# Patient Record
Sex: Female | Born: 1935 | Race: White | Hispanic: No | Smoking: Former smoker
Health system: Southern US, Community
[De-identification: ages and names within clinical notes are randomized; demographics above are authoritative.]

## PROBLEM LIST (undated history)

## (undated) DIAGNOSIS — I890 Lymphedema, not elsewhere classified: Secondary | ICD-10-CM

## (undated) DIAGNOSIS — B351 Tinea unguium: Secondary | ICD-10-CM

## (undated) HISTORY — DX: Lymphedema, not elsewhere classified: I89.0

## (undated) HISTORY — DX: Tinea unguium: B35.1

---

## 2005-12-12 ENCOUNTER — Ambulatory Visit: Payer: Self-pay | Admitting: Internal Medicine

## 2006-12-18 ENCOUNTER — Ambulatory Visit: Payer: Self-pay | Admitting: Internal Medicine

## 2007-12-23 ENCOUNTER — Ambulatory Visit: Payer: Self-pay | Admitting: Internal Medicine

## 2008-12-30 ENCOUNTER — Ambulatory Visit: Payer: Self-pay | Admitting: Internal Medicine

## 2010-01-25 ENCOUNTER — Ambulatory Visit: Payer: Self-pay | Admitting: Internal Medicine

## 2011-01-30 ENCOUNTER — Ambulatory Visit: Payer: Self-pay | Admitting: Internal Medicine

## 2017-07-31 ENCOUNTER — Other Ambulatory Visit: Payer: Self-pay | Admitting: Internal Medicine

## 2017-07-31 ENCOUNTER — Ambulatory Visit
Admission: RE | Admit: 2017-07-31 | Discharge: 2017-07-31 | Disposition: A | Discharge status: Home / Self Care | Payer: Medicare Other | Source: Ambulatory Visit | Attending: Internal Medicine | Admitting: Internal Medicine

## 2017-07-31 DIAGNOSIS — M25551 Pain in right hip: Secondary | ICD-10-CM | POA: Diagnosis not present

## 2017-07-31 DIAGNOSIS — M1611 Unilateral primary osteoarthritis, right hip: Secondary | ICD-10-CM | POA: Diagnosis not present

## 2017-07-31 DIAGNOSIS — M419 Scoliosis, unspecified: Secondary | ICD-10-CM | POA: Diagnosis not present

## 2017-07-31 DIAGNOSIS — M47816 Spondylosis without myelopathy or radiculopathy, lumbar region: Secondary | ICD-10-CM | POA: Diagnosis not present

## 2017-07-31 DIAGNOSIS — M79604 Pain in right leg: Secondary | ICD-10-CM | POA: Diagnosis not present

## 2018-05-03 IMAGING — DX DG FEMUR 2+V*R*
5 series · 5 of 5 positions shown · non-contrast
Comparison: None.

CLINICAL DATA: Right hip pain and right leg pain 3 years, worse in
last 6 months. No recent injury.

EXAM:
RIGHT FEMUR 2 VIEWS

[femur ap (1 of 2)]
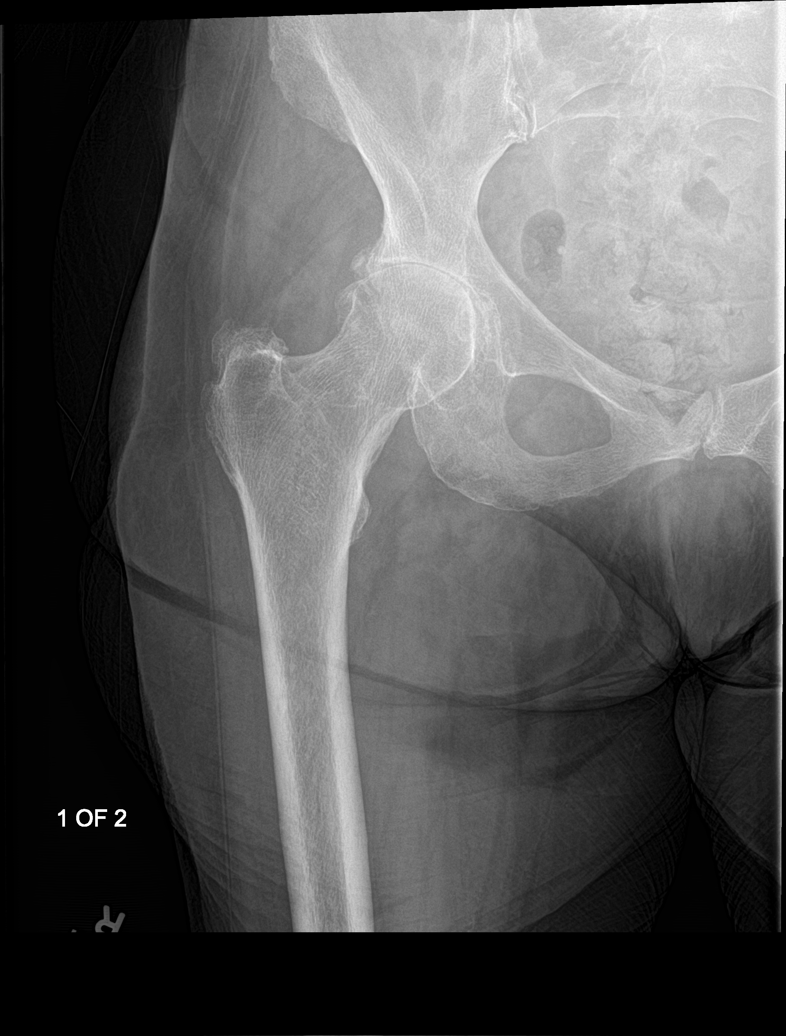

[femur ap (2 of 2)]
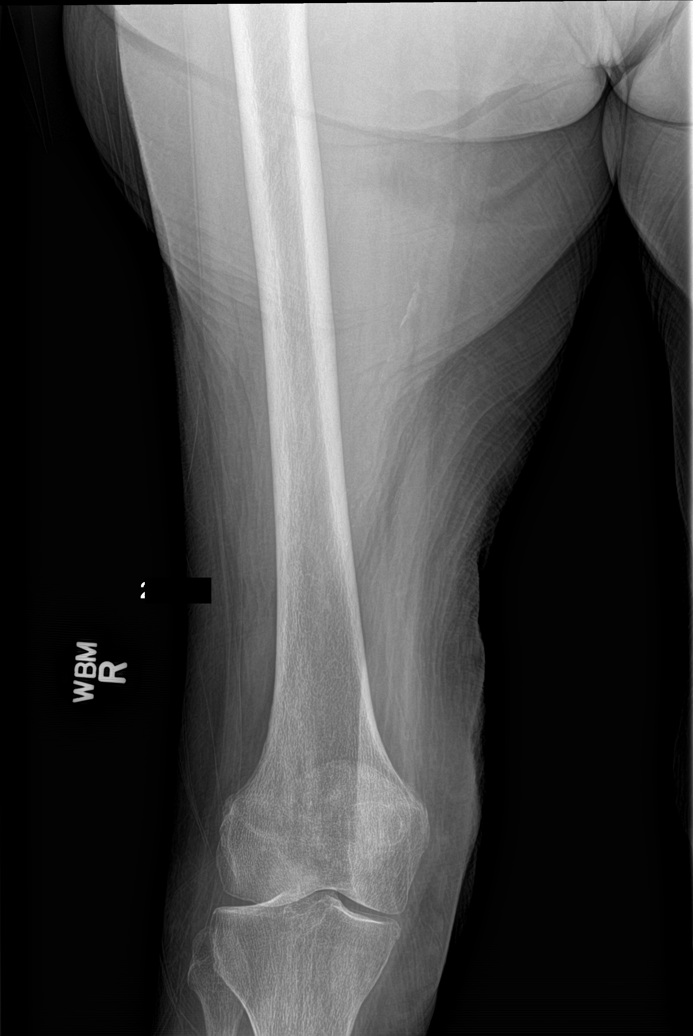

[femur lat (1 of 3)]
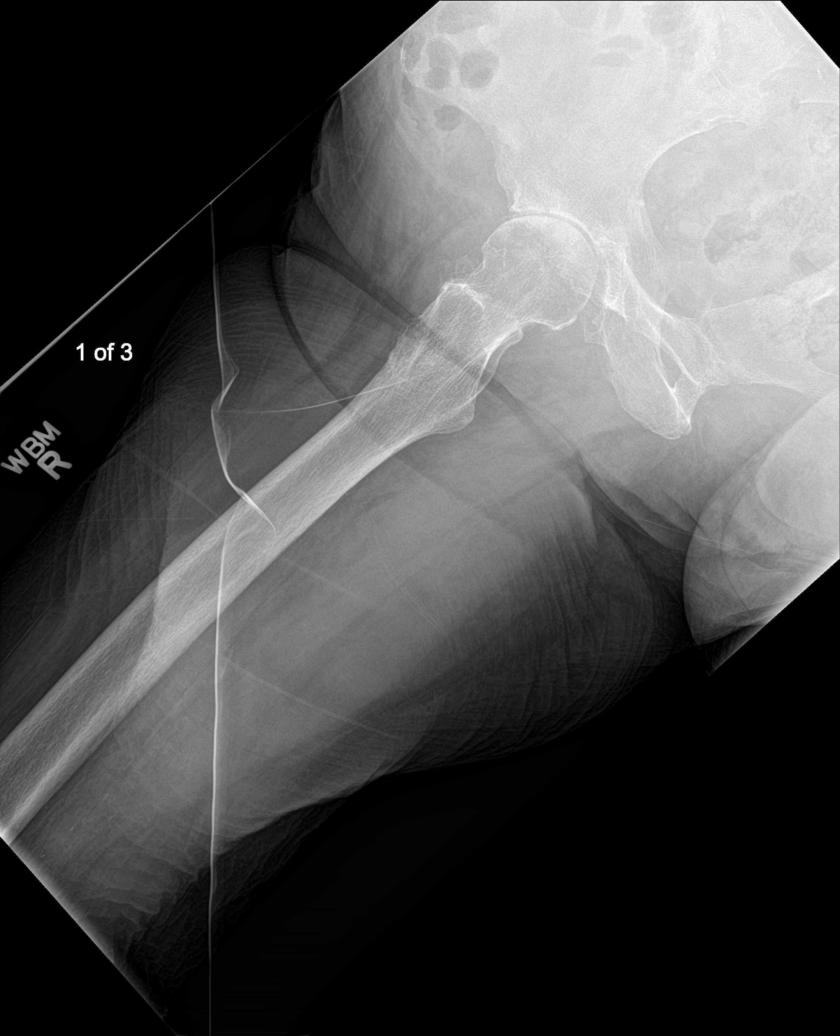

[femur lat (2 of 3)]
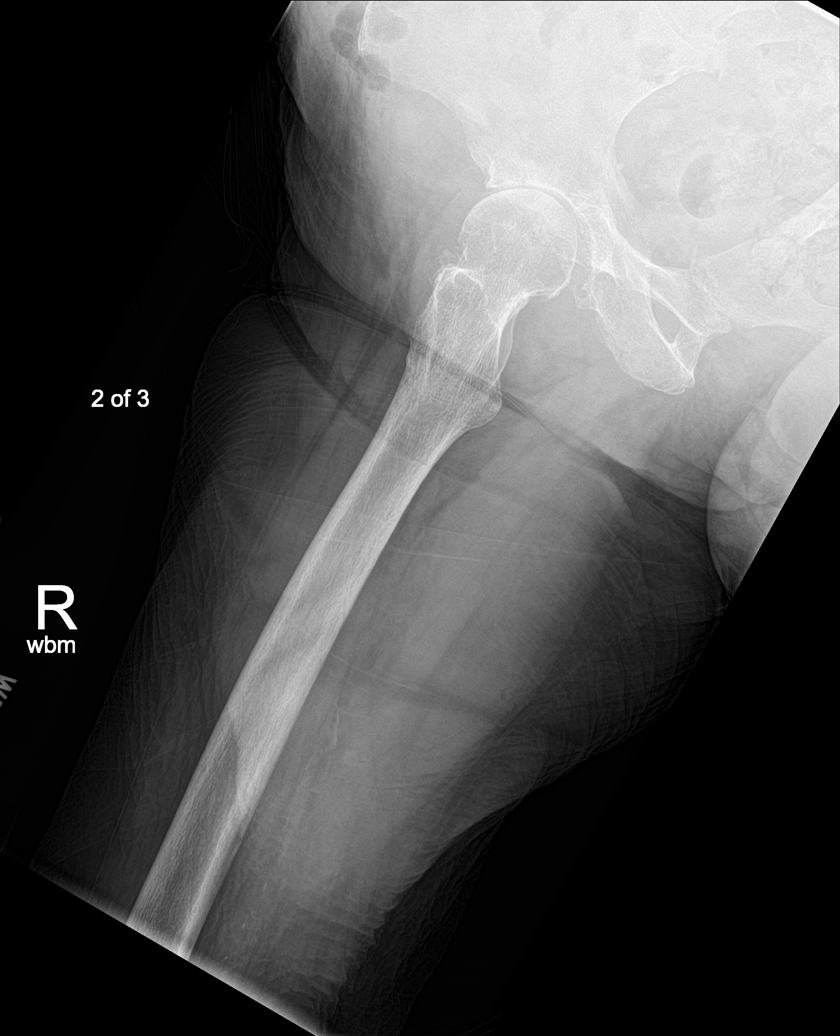

[femur lat (3 of 3)]
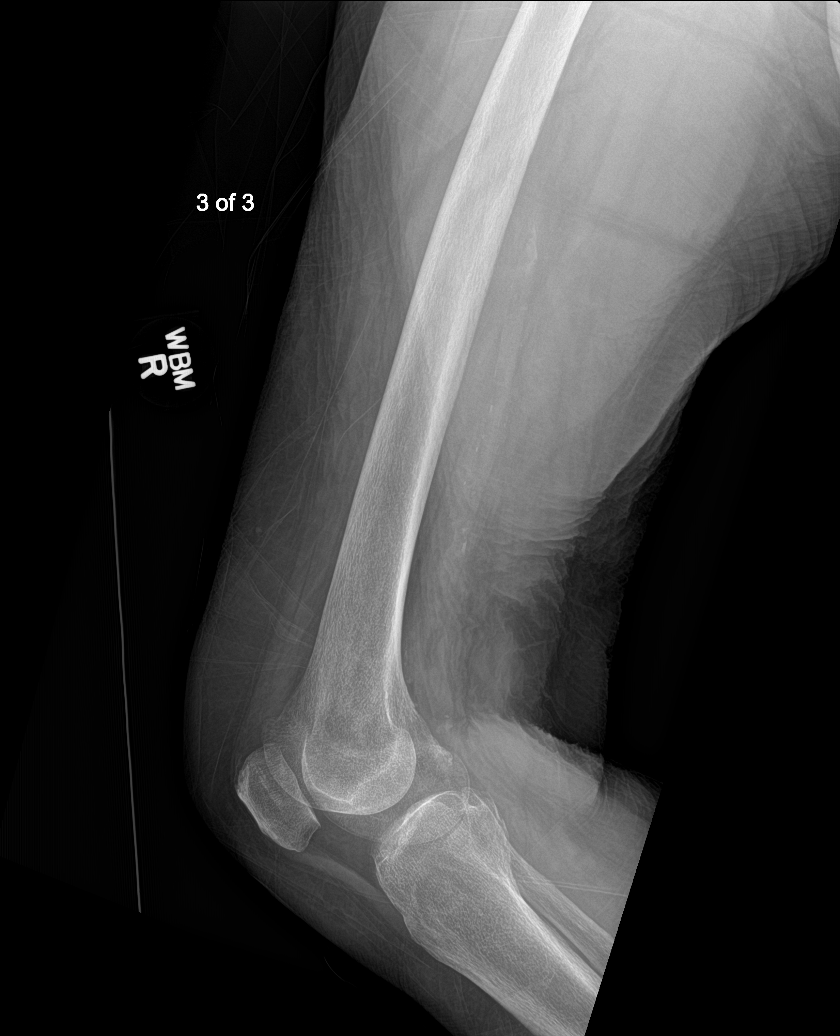

[5 of 5 positions shown; findings below may reference images not displayed]

FINDINGS: Normal osseous alignment. No acute appearing fracture line or
displaced fracture fragment identified.

Advanced degenerative joint space narrowing at the right hip joint,
with associated articular surface sclerosis and osseous spurring. No
significant degenerative change at the right knee. Soft tissues
about the right femur are unremarkable.
IMPRESSION: 1. No acute findings.
2. Advanced degenerative joint space narrowing at the right hip
joint, with near abutment of the right femoral head and acetabulum,
with associated articular surface sclerosis and osseous spurring,
presumably degenerative osteoarthritis.

## 2019-01-13 ENCOUNTER — Other Ambulatory Visit: Payer: Self-pay

## 2019-01-13 ENCOUNTER — Ambulatory Visit (INDEPENDENT_AMBULATORY_CARE_PROVIDER_SITE_OTHER): Payer: Medicare Other | Admitting: Podiatry

## 2019-01-13 ENCOUNTER — Encounter: Payer: Self-pay | Admitting: Podiatry

## 2019-01-13 VITALS — Temp 98.7°F

## 2019-01-13 DIAGNOSIS — B351 Tinea unguium: Secondary | ICD-10-CM

## 2019-01-13 DIAGNOSIS — M79675 Pain in left toe(s): Secondary | ICD-10-CM

## 2019-01-13 DIAGNOSIS — M79674 Pain in right toe(s): Secondary | ICD-10-CM | POA: Diagnosis not present

## 2019-01-13 NOTE — Progress Notes (Addendum)
Complaint:  Visit Type: Patient presents  to my office for preventative foot care services. Complaint: Patient states" my nails have grown long and thick and become painful to walk and wear shoes" Patient has no pain noted on her corn left foot and callus under right foot. The patient presents for preventative foot care services. No changes to ROS  Podiatric Exam: Vascular: dorsalis pedis and posterior tibial pulses are palpable bilateral. Capillary return is immediate. Temperature gradient is WNL. Skin turgor WNL  Sensorium: Normal Semmes Weinstein monofilament test. Normal tactile sensation bilaterally. Nail Exam: Pt has thick disfigured discolored nails with subungual debris noted bilateral entire nail hallux through fifth toenails Ulcer Exam: There is no evidence of ulcer or pre-ulcerative changes or infection. Orthopedic Exam: Muscle tone and strength are WNL. No limitations in general ROM. No crepitus or effusions noted. Foot type and digits show no abnormalities. HAV  B/L. Skin:  Porokeratosis sub th met right.  Distal clavi 3rd toe left foot.  Callus are asymptomatic.. No infection or ulcers.  Skin scab left ankle.  Diagnosis:  Onychomycosis, , Pain in right toe, pain in left toes  Treatment & Plan Procedures and Treatment: Consent by patient was obtained for treatment procedures.   Debridement of mycotic and hypertrophic toenails, 1 through 5 bilateral and clearing of subungual debris. No ulceration, no infection noted.  Return Visit-Office Procedure: Patient instructed to return to the office for a follow up visit 3 months for continued evaluation and treatment.    Veverly Larimer DPM 

## 2019-04-17 ENCOUNTER — Other Ambulatory Visit: Payer: Self-pay

## 2019-04-17 ENCOUNTER — Ambulatory Visit (INDEPENDENT_AMBULATORY_CARE_PROVIDER_SITE_OTHER): Payer: Medicare Other | Admitting: Podiatry

## 2019-04-17 ENCOUNTER — Encounter: Payer: Self-pay | Admitting: Podiatry

## 2019-04-17 VITALS — Temp 98.9°F

## 2019-04-17 DIAGNOSIS — M79675 Pain in left toe(s): Secondary | ICD-10-CM | POA: Diagnosis not present

## 2019-04-17 DIAGNOSIS — B351 Tinea unguium: Secondary | ICD-10-CM | POA: Diagnosis not present

## 2019-04-17 DIAGNOSIS — M79674 Pain in right toe(s): Secondary | ICD-10-CM | POA: Diagnosis not present

## 2019-04-17 NOTE — Progress Notes (Signed)
Complaint:  Visit Type: Patient presents  to my office for preventative foot care services. Complaint: Patient states" my nails have grown long and thick and become painful to walk and wear shoes" Patient has no pain noted on her corn left foot and callus under right foot. The patient presents for preventative foot care services. No changes to ROS  Podiatric Exam: Vascular: dorsalis pedis and posterior tibial pulses are palpable bilateral. Capillary return is immediate. Temperature gradient is WNL. Skin turgor WNL  Sensorium: Normal Semmes Weinstein monofilament test. Normal tactile sensation bilaterally. Nail Exam: Pt has thick disfigured discolored nails with subungual debris noted bilateral entire nail hallux through fifth toenails Ulcer Exam: There is no evidence of ulcer or pre-ulcerative changes or infection. Orthopedic Exam: Muscle tone and strength are WNL. No limitations in general ROM. No crepitus or effusions noted. Foot type and digits show no abnormalities. HAV  B/L. Skin:  Porokeratosis sub th met right.  Distal clavi 3rd toe left foot.  Callus are asymptomatic.. No infection or ulcers.  Skin scab left ankle.  Diagnosis:  Onychomycosis, , Pain in right toe, pain in left toes  Treatment & Plan Procedures and Treatment: Consent by patient was obtained for treatment procedures.   Debridement of mycotic and hypertrophic toenails, 1 through 5 bilateral and clearing of subungual debris. No ulceration, no infection noted.  Return Visit-Office Procedure: Patient instructed to return to the office for a follow up visit 3 months for continued evaluation and treatment.    Hiroyuki Ozanich DPM 

## 2019-07-17 ENCOUNTER — Ambulatory Visit: Payer: Medicare Other | Admitting: Podiatry

## 2019-07-21 ENCOUNTER — Ambulatory Visit (INDEPENDENT_AMBULATORY_CARE_PROVIDER_SITE_OTHER): Payer: Medicare Other | Admitting: Podiatry

## 2019-07-21 ENCOUNTER — Encounter: Payer: Self-pay | Admitting: Podiatry

## 2019-07-21 ENCOUNTER — Other Ambulatory Visit: Payer: Self-pay

## 2019-07-21 DIAGNOSIS — B351 Tinea unguium: Secondary | ICD-10-CM

## 2019-07-21 DIAGNOSIS — M79674 Pain in right toe(s): Secondary | ICD-10-CM

## 2019-07-21 DIAGNOSIS — M79675 Pain in left toe(s): Secondary | ICD-10-CM

## 2019-07-21 NOTE — Progress Notes (Signed)
Complaint:  Visit Type: Patient presents  to my office for preventative foot care services. Complaint: Patient states" my nails have grown long and thick and become painful to walk and wear shoes" Patient has no pain noted on her corn left foot and callus under right foot. The patient presents for preventative foot care services. No changes to ROS  Podiatric Exam: Vascular: dorsalis pedis and posterior tibial pulses are palpable bilateral. Capillary return is immediate. Temperature gradient is WNL. Skin turgor WNL  Sensorium: Normal Semmes Weinstein monofilament test. Normal tactile sensation bilaterally. Nail Exam: Pt has thick disfigured discolored nails with subungual debris noted bilateral entire nail hallux through fifth toenails Ulcer Exam: There is no evidence of ulcer or pre-ulcerative changes or infection. Orthopedic Exam: Muscle tone and strength are WNL. No limitations in general ROM. No crepitus or effusions noted. Foot type and digits show no abnormalities. HAV  B/L. Skin:  Porokeratosis sub th met right.  Distal clavi 3rd toe left foot.  Callus are asymptomatic.Marland Kitchen No infection or ulcers.  Skin scab left ankle.  Diagnosis:  Onychomycosis, , Pain in right toe, pain in left toes  Treatment & Plan Procedures and Treatment: Consent by patient was obtained for treatment procedures.   Debridement of mycotic and hypertrophic toenails, 1 through 5 bilateral and clearing of subungual debris. No ulceration, no infection noted.  Return Visit-Office Procedure: Patient instructed to return to the office for a follow up visit 3 months for continued evaluation and treatment.    Gardiner Barefoot DPM

## 2019-10-20 ENCOUNTER — Ambulatory Visit: Payer: Medicare Other | Admitting: Podiatry

## 2019-10-23 ENCOUNTER — Ambulatory Visit (INDEPENDENT_AMBULATORY_CARE_PROVIDER_SITE_OTHER): Payer: Medicare Other | Admitting: Podiatry

## 2019-10-23 ENCOUNTER — Other Ambulatory Visit: Payer: Self-pay

## 2019-10-23 ENCOUNTER — Encounter: Payer: Self-pay | Admitting: Podiatry

## 2019-10-23 DIAGNOSIS — M79674 Pain in right toe(s): Secondary | ICD-10-CM | POA: Diagnosis not present

## 2019-10-23 DIAGNOSIS — B351 Tinea unguium: Secondary | ICD-10-CM | POA: Diagnosis not present

## 2019-10-23 DIAGNOSIS — M79675 Pain in left toe(s): Secondary | ICD-10-CM | POA: Diagnosis not present

## 2019-10-23 NOTE — Progress Notes (Signed)
Complaint:  Visit Type: Patient presents  to my office for preventative foot care services. Complaint: Patient states" my nails have grown long and thick and become painful to walk and wear shoes" Patient has no pain noted on her corn left foot and callus under right foot. The patient presents for preventative foot care services. No changes to ROS  Podiatric Exam: Vascular: dorsalis pedis and posterior tibial pulses are palpable bilateral. Capillary return is immediate. Temperature gradient is WNL. Skin turgor WNL  Sensorium: Normal Semmes Weinstein monofilament test. Normal tactile sensation bilaterally. Nail Exam: Pt has thick disfigured discolored nails with subungual debris noted bilateral entire nail hallux through fifth toenails Ulcer Exam: There is no evidence of ulcer or pre-ulcerative changes or infection. Orthopedic Exam: Muscle tone and strength are WNL. No limitations in general ROM. No crepitus or effusions noted. Foot type and digits show no abnormalities. HAV  B/L. Skin:      Callus are asymptomatic.Marland Kitchen No infection or ulcers. No infections or ulcers.  Diagnosis:  Onychomycosis, , Pain in right toe, pain in left toes  Treatment & Plan Procedures and Treatment: Consent by patient was obtained for treatment procedures.   Debridement of mycotic and hypertrophic toenails, 1 through 5 bilateral and clearing of subungual debris. No ulceration, no infection noted.  Return Visit-Office Procedure: Patient instructed to return to the office for a follow up visit 3 months for continued evaluation and treatment.    Helane Gunther DPM

## 2020-01-22 ENCOUNTER — Other Ambulatory Visit: Payer: Self-pay

## 2020-01-22 ENCOUNTER — Encounter: Payer: Self-pay | Admitting: Podiatry

## 2020-01-22 ENCOUNTER — Ambulatory Visit (INDEPENDENT_AMBULATORY_CARE_PROVIDER_SITE_OTHER): Payer: Medicare Other | Admitting: Podiatry

## 2020-01-22 VITALS — Temp 97.9°F

## 2020-01-22 DIAGNOSIS — B351 Tinea unguium: Secondary | ICD-10-CM

## 2020-01-22 DIAGNOSIS — M79675 Pain in left toe(s): Secondary | ICD-10-CM | POA: Diagnosis not present

## 2020-01-22 DIAGNOSIS — M79674 Pain in right toe(s): Secondary | ICD-10-CM | POA: Diagnosis not present

## 2020-01-22 NOTE — Progress Notes (Signed)
This patient presents to the office with chief complaint of long thick painful nails.  Patient says the nails are painful walking and wearing shoes.  This patient is unable to self treat.  This patient is unable to trim her nails since she is unable to reach her nails.  She presents to the office for preventative foot care services.  General Appearance  Alert, conversant and in no acute stress.  Vascular  Dorsalis pedis and posterior tibial  pulses are weakly  palpable  bilaterally.  Capillary return is within normal limits  bilaterally. Temperature is within normal limits  bilaterally.  Neurologic  Senn-Weinstein monofilament wire test within normal limits  bilaterally. Muscle power within normal limits bilaterally.  Nails Thick disfigured discolored nails with subungual debris  from hallux to fifth toes bilaterally. No evidence of bacterial infection or drainage bilaterally.  Orthopedic  No limitations of motion  feet .  No crepitus or effusions noted.  No bony pathology or digital deformities noted.  HAV  B/L  Contracted toes 2-5  B/L.  Skin  normotropic skin with no porokeratosis noted bilaterally.  No signs of infections or ulcers noted.     Onychomycosis  Nails  B/L.  Pain in right toes  Pain in left toes  Debridement of nails both feet followed trimming the nails with dremel tool.    RTC 3 months.   Helane Gunther DPM

## 2020-04-26 ENCOUNTER — Encounter: Payer: Self-pay | Admitting: Podiatry

## 2020-04-26 ENCOUNTER — Other Ambulatory Visit: Payer: Self-pay

## 2020-04-26 ENCOUNTER — Ambulatory Visit (INDEPENDENT_AMBULATORY_CARE_PROVIDER_SITE_OTHER): Payer: Medicare Other | Admitting: Podiatry

## 2020-04-26 DIAGNOSIS — B351 Tinea unguium: Secondary | ICD-10-CM

## 2020-04-26 DIAGNOSIS — M79674 Pain in right toe(s): Secondary | ICD-10-CM | POA: Diagnosis not present

## 2020-04-26 DIAGNOSIS — M79675 Pain in left toe(s): Secondary | ICD-10-CM | POA: Diagnosis not present

## 2020-04-26 NOTE — Progress Notes (Addendum)
This patient presents to the office with chief complaint of long thick painful nails.  Patient says the nails are painful walking and wearing shoes.  This patient is unable to self treat.  This patient is unable to trim her nails since she is unable to reach her nails.  She presents to the office for preventative foot care services. She presents to the office with her daughter.  General Appearance  Alert, conversant and in no acute stress.  Vascular  Dorsalis pedis and posterior tibial  pulses are weakly  palpable  bilaterally.  Capillary return is within normal limits  bilaterally. Temperature is within normal limits  bilaterally.  Neurologic  Senn-Weinstein monofilament wire test within normal limits  bilaterally. Muscle power within normal limits bilaterally.  Nails Thick disfigured discolored nails with subungual debris  from hallux to fifth toes bilaterally. No evidence of bacterial infection or drainage bilaterally.  Orthopedic  No limitations of motion  feet .  No crepitus or effusions noted.  No bony pathology or digital deformities noted.  HAV  B/L  Contracted toes 2-5  B/L.  Skin  normotropic skin with no porokeratosis noted bilaterally.  No signs of infections or ulcers noted.     Onychomycosis  Nails  B/L.  Pain in right toes  Pain in left toes  Debridement of nails both feet followed trimming the nails with dremel tool.    RTC 3 months.   Helane Gunther DPM

## 2020-07-07 ENCOUNTER — Ambulatory Visit
Admission: EM | Admit: 2020-07-07 | Discharge: 2020-07-07 | Disposition: A | Discharge status: Home / Self Care | Payer: Medicare Other

## 2020-07-07 NOTE — ED Notes (Signed)
Pt family has decided to go elsewhere for care. Wendee Beavers NP made aware of family decision.

## 2020-07-07 NOTE — ED Triage Notes (Signed)
Patient presents with complaints of left sided hip and femur pain. No fall prior to pain starting. Patient family is requesting an x-ray. Patient unable to stand for x-ray. Per radiology tech and provider patient will have to be able to stand for hip and femur x-ray. This RN gave family the options of potentially ordering an outpatient x ray where they have capability for her to lay down for her x-ray. Family is discussing this together now and will decide.

## 2020-07-29 ENCOUNTER — Ambulatory Visit (INDEPENDENT_AMBULATORY_CARE_PROVIDER_SITE_OTHER): Payer: Medicare Other | Admitting: Podiatry

## 2020-07-29 ENCOUNTER — Encounter: Payer: Self-pay | Admitting: Podiatry

## 2020-07-29 ENCOUNTER — Other Ambulatory Visit: Payer: Self-pay

## 2020-07-29 DIAGNOSIS — M79674 Pain in right toe(s): Secondary | ICD-10-CM | POA: Diagnosis not present

## 2020-07-29 DIAGNOSIS — M79675 Pain in left toe(s): Secondary | ICD-10-CM

## 2020-07-29 DIAGNOSIS — B351 Tinea unguium: Secondary | ICD-10-CM

## 2020-07-29 NOTE — Progress Notes (Signed)
This patient presents to the office with chief complaint of long thick painful nails.  Patient says the nails are painful walking and wearing shoes.  This patient is unable to self treat.  This patient is unable to trim her nails since she is unable to reach her nails.  She presents to the office for preventative foot care services. She presents to the office with her daughter.  General Appearance  Alert, conversant and in no acute stress.  Vascular  Dorsalis pedis and posterior tibial  pulses are weakly  palpable  bilaterally.  Capillary return is within normal limits  bilaterally. Temperature is within normal limits  bilaterally.  Neurologic  Senn-Weinstein monofilament wire test within normal limits  bilaterally. Muscle power within normal limits bilaterally.  Nails Thick disfigured discolored nails with subungual debris  from hallux to fifth toes bilaterally. No evidence of bacterial infection or drainage bilaterally.  Orthopedic  No limitations of motion  feet .  No crepitus or effusions noted.  No bony pathology or digital deformities noted.  HAV  B/L  Contracted toes 2-5  B/L.  Skin  normotropic skin with no porokeratosis noted bilaterally.  No signs of infections or ulcers noted.     Onychomycosis  Nails  B/L.  Pain in right toes  Pain in left toes  Debridement of nails both feet followed trimming the nails with dremel tool.    RTC 3 months.   Helane Gunther DPM

## 2020-11-01 ENCOUNTER — Other Ambulatory Visit: Payer: Self-pay

## 2020-11-01 ENCOUNTER — Encounter: Payer: Self-pay | Admitting: Podiatry

## 2020-11-01 ENCOUNTER — Ambulatory Visit (INDEPENDENT_AMBULATORY_CARE_PROVIDER_SITE_OTHER): Payer: Medicare Other | Admitting: Podiatry

## 2020-11-01 DIAGNOSIS — M79675 Pain in left toe(s): Secondary | ICD-10-CM | POA: Diagnosis not present

## 2020-11-01 DIAGNOSIS — B351 Tinea unguium: Secondary | ICD-10-CM

## 2020-11-01 DIAGNOSIS — M79674 Pain in right toe(s): Secondary | ICD-10-CM | POA: Diagnosis not present

## 2020-11-01 NOTE — Progress Notes (Signed)
This patient presents to the office with chief complaint of long thick painful nails.  Patient says the nails are painful walking and wearing shoes.  This patient is unable to self treat.  This patient is unable to trim her nails since she is unable to reach her nails.  She presents to the office for preventative foot care services. She presents to the office with her daughter.  She is seen in her wheelchair.  General Appearance  Alert, conversant and in no acute stress.  Vascular  Dorsalis pedis and posterior tibial  pulses are weakly  palpable  bilaterally.  Capillary return is within normal limits  bilaterally. Temperature is within normal limits  bilaterally.  Neurologic  Senn-Weinstein monofilament wire test within normal limits  bilaterally. Muscle power within normal limits bilaterally.  Nails Thick disfigured discolored nails with subungual debris  from hallux to fifth toes bilaterally. No evidence of bacterial infection or drainage bilaterally.  Orthopedic  No limitations of motion  feet .  No crepitus or effusions noted.  No bony pathology or digital deformities noted.  HAV  B/L  Contracted toes 2-5  B/L.  Skin  normotropic skin with no porokeratosis noted bilaterally.  No signs of infections or ulcers noted.     Onychomycosis  Nails  B/L.  Pain in right toes  Pain in left toes  Debridement of nails both feet followed trimming the nails with dremel tool.    RTC 3 months.   Helane Gunther DPM

## 2021-01-31 ENCOUNTER — Telehealth: Payer: Self-pay | Admitting: Podiatry

## 2021-01-31 ENCOUNTER — Ambulatory Visit: Payer: Medicare Other | Admitting: Podiatry

## 2021-01-31 NOTE — Telephone Encounter (Signed)
Called patient lvm to reschedule 5/19 appt with Dr. Stacie Acres

## 2021-02-03 ENCOUNTER — Ambulatory Visit: Payer: Medicare Other | Admitting: Podiatry

## 2021-02-21 ENCOUNTER — Encounter: Payer: Self-pay | Admitting: Podiatry

## 2021-02-21 ENCOUNTER — Other Ambulatory Visit: Payer: Self-pay

## 2021-02-21 ENCOUNTER — Ambulatory Visit (INDEPENDENT_AMBULATORY_CARE_PROVIDER_SITE_OTHER): Payer: Medicare Other | Admitting: Podiatry

## 2021-02-21 DIAGNOSIS — M79674 Pain in right toe(s): Secondary | ICD-10-CM | POA: Diagnosis not present

## 2021-02-21 DIAGNOSIS — M79675 Pain in left toe(s): Secondary | ICD-10-CM

## 2021-02-21 DIAGNOSIS — B351 Tinea unguium: Secondary | ICD-10-CM | POA: Diagnosis not present

## 2021-02-21 NOTE — Progress Notes (Signed)
This patient presents to the office with chief complaint of long thick painful nails.  Patient says the nails are painful walking and wearing shoes.  This patient is unable to self treat.  This patient is unable to trim her nails since she is unable to reach her nails.  She presents to the office for preventative foot care services. She presents to the office with her daughter.  She is seen in her wheelchair.  General Appearance  Alert, conversant and in no acute stress.  Vascular  Dorsalis pedis and posterior tibial  pulses are weakly  palpable  bilaterally.  Capillary return is within normal limits  bilaterally. Temperature is within normal limits  bilaterally.  Neurologic  Senn-Weinstein monofilament wire test within normal limits  bilaterally. Muscle power within normal limits bilaterally.  Nails Thick disfigured discolored nails with subungual debris  from hallux to fifth toes bilaterally. No evidence of bacterial infection or drainage bilaterally.  Pincer nails  B/L.  Orthopedic  No limitations of motion  feet .  No crepitus or effusions noted.  No bony pathology or digital deformities noted.  HAV  B/L  Contracted toes 2-5  B/L.  Skin  normotropic skin with no porokeratosis noted bilaterally.  No signs of infections or ulcers noted.     Onychomycosis  Nails  B/L.  Pain in right toes  Pain in left toes  Debridement of nails both feet followed trimming the nails with dremel tool.    RTC 3 months.   Helane Gunther DPM

## 2021-05-26 ENCOUNTER — Encounter: Payer: Self-pay | Admitting: Podiatry

## 2021-05-26 ENCOUNTER — Other Ambulatory Visit: Payer: Self-pay

## 2021-05-26 ENCOUNTER — Ambulatory Visit (INDEPENDENT_AMBULATORY_CARE_PROVIDER_SITE_OTHER): Payer: Medicare Other | Admitting: Podiatry

## 2021-05-26 DIAGNOSIS — M79675 Pain in left toe(s): Secondary | ICD-10-CM

## 2021-05-26 DIAGNOSIS — M79674 Pain in right toe(s): Secondary | ICD-10-CM | POA: Diagnosis not present

## 2021-05-26 DIAGNOSIS — B351 Tinea unguium: Secondary | ICD-10-CM | POA: Diagnosis not present

## 2021-05-26 NOTE — Progress Notes (Signed)
This patient presents to the office with chief complaint of long thick painful nails.  Patient says the nails are painful walking and wearing shoes.  This patient is unable to self treat.  This patient is unable to trim her nails since she is unable to reach her nails.  She presents to the office for preventative foot care services. She presents to the office with her daughter.  She is seen in her wheelchair.  General Appearance  Alert, conversant and in no acute stress.  Vascular  Dorsalis pedis and posterior tibial  pulses are weakly  palpable  bilaterally.  Capillary return is within normal limits  bilaterally. Temperature is within normal limits  bilaterally.  Neurologic  Senn-Weinstein monofilament wire test within normal limits  bilaterally. Muscle power within normal limits bilaterally.  Nails Thick disfigured discolored nails with subungual debris  from hallux to fifth toes bilaterally. No evidence of bacterial infection or drainage bilaterally.  Pincer nails  B/L.  Orthopedic  No limitations of motion  feet .  No crepitus or effusions noted.  No bony pathology or digital deformities noted.  HAV  B/L  Contracted toes 2-5  B/L.  Skin  normotropic skin with no porokeratosis noted bilaterally.  No signs of infections or ulcers noted.     Onychomycosis  Nails  B/L.  Pain in right toes  Pain in left toes  Debridement of nails both feet followed trimming the nails with dremel tool.    RTC 3 months.   Helane Gunther DPM

## 2021-08-25 ENCOUNTER — Other Ambulatory Visit: Payer: Self-pay

## 2021-08-25 ENCOUNTER — Encounter: Payer: Self-pay | Admitting: Podiatry

## 2021-08-25 ENCOUNTER — Ambulatory Visit (INDEPENDENT_AMBULATORY_CARE_PROVIDER_SITE_OTHER): Payer: Medicare Other | Admitting: Podiatry

## 2021-08-25 DIAGNOSIS — M79674 Pain in right toe(s): Secondary | ICD-10-CM

## 2021-08-25 DIAGNOSIS — B351 Tinea unguium: Secondary | ICD-10-CM

## 2021-08-25 DIAGNOSIS — M79675 Pain in left toe(s): Secondary | ICD-10-CM | POA: Diagnosis not present

## 2021-08-25 NOTE — Progress Notes (Signed)
This patient presents to the office with chief complaint of long thick painful nails.  Patient says the nails are painful walking and wearing shoes.  This patient is unable to self treat.  This patient is unable to trim her nails since she is unable to reach her nails.  She presents to the office for preventative foot care services. She presents to the office with her daughter.  She is seen in her wheelchair.  General Appearance  Alert, conversant and in no acute stress.  Vascular  Dorsalis pedis and posterior tibial  pulses are weakly  palpable  bilaterally.  Capillary return is within normal limits  bilaterally. Temperature is within normal limits  bilaterally.  Neurologic  Senn-Weinstein monofilament wire test within normal limits  bilaterally. Muscle power within normal limits bilaterally.  Nails Thick disfigured discolored nails with subungual debris  from hallux to fifth toes bilaterally. No evidence of bacterial infection or drainage bilaterally.  Pincer nails  B/L.  Orthopedic  No limitations of motion  feet .  No crepitus or effusions noted.  No bony pathology or digital deformities noted.  HAV  B/L  Contracted toes 2-5  B/L.  Skin  normotropic skin with no porokeratosis noted bilaterally.  No signs of infections or ulcers noted.     Onychomycosis  Nails  B/L.  Pain in right toes  Pain in left toes  Debridement of nails both feet followed trimming the nails with dremel tool.    RTC 3 months.   Helane Gunther DPM

## 2021-09-23 DIAGNOSIS — M1611 Unilateral primary osteoarthritis, right hip: Secondary | ICD-10-CM | POA: Insufficient documentation

## 2021-11-24 ENCOUNTER — Encounter: Payer: Self-pay | Admitting: Podiatry

## 2021-11-24 ENCOUNTER — Other Ambulatory Visit: Payer: Self-pay

## 2021-11-24 ENCOUNTER — Ambulatory Visit (INDEPENDENT_AMBULATORY_CARE_PROVIDER_SITE_OTHER): Payer: Medicare Other | Admitting: Podiatry

## 2021-11-24 DIAGNOSIS — B351 Tinea unguium: Secondary | ICD-10-CM | POA: Diagnosis not present

## 2021-11-24 DIAGNOSIS — M79674 Pain in right toe(s): Secondary | ICD-10-CM | POA: Diagnosis not present

## 2021-11-24 DIAGNOSIS — M79675 Pain in left toe(s): Secondary | ICD-10-CM | POA: Diagnosis not present

## 2021-11-24 NOTE — Progress Notes (Signed)
This patient presents to the office with chief complaint of long thick painful nails.  Patient says the nails are painful walking and wearing shoes.  This patient is unable to self treat.  This patient is unable to trim her nails since she is unable to reach her nails.  She presents to the office for preventative foot care services. She presents to the office with her daughter.  She is seen in her wheelchair.  General Appearance  Alert, conversant and in no acute stress.  Vascular  Dorsalis pedis and posterior tibial  pulses are weakly  palpable  bilaterally.  Capillary return is within normal limits  bilaterally. Temperature is within normal limits  bilaterally.  Neurologic  Senn-Weinstein monofilament wire test within normal limits  bilaterally. Muscle power within normal limits bilaterally.  Nails Thick disfigured discolored nails with subungual debris  from hallux to fifth toes bilaterally. No evidence of bacterial infection or drainage bilaterally.  Pincer nails  B/L.  Orthopedic  No limitations of motion  feet .  No crepitus or effusions noted.  No bony pathology or digital deformities noted.  HAV  B/L  Contracted toes 2-5  B/L.  Skin  normotropic skin with no porokeratosis noted bilaterally.  No signs of infections or ulcers noted.     Onychomycosis  Nails  B/L.  Pain in right toes  Pain in left toes  Debridement of nails both feet followed trimming the nails with dremel tool.    RTC 3 months.   Trenity Pha DPM  

## 2022-03-02 ENCOUNTER — Encounter: Payer: Self-pay | Admitting: Podiatry

## 2022-03-02 ENCOUNTER — Ambulatory Visit (INDEPENDENT_AMBULATORY_CARE_PROVIDER_SITE_OTHER): Payer: Medicare Other | Admitting: Podiatry

## 2022-03-02 DIAGNOSIS — M79675 Pain in left toe(s): Secondary | ICD-10-CM

## 2022-03-02 DIAGNOSIS — B351 Tinea unguium: Secondary | ICD-10-CM | POA: Diagnosis not present

## 2022-03-02 DIAGNOSIS — M79674 Pain in right toe(s): Secondary | ICD-10-CM

## 2022-03-02 DIAGNOSIS — L608 Other nail disorders: Secondary | ICD-10-CM

## 2022-03-02 NOTE — Progress Notes (Signed)
This patient presents to the office with chief complaint of long thick painful nails.  Patient says the nails are painful walking and wearing shoes.  This patient is unable to self treat.  This patient is unable to trim her nails since she is unable to reach her nails.  She presents to the office for preventative foot care services. She presents to the office with her daughter.  She is seen in her wheelchair.  General Appearance  Alert, conversant and in no acute stress.  Vascular  Dorsalis pedis and posterior tibial  pulses are weakly  palpable  bilaterally.  Capillary return is within normal limits  bilaterally. Temperature is within normal limits  bilaterally.  Neurologic  Senn-Weinstein monofilament wire test within normal limits  bilaterally. Muscle power within normal limits bilaterally.  Nails Thick disfigured discolored nails with subungual debris  from hallux to fifth toes bilaterally. No evidence of bacterial infection or drainage bilaterally.  Pincer nails  B/L.  Orthopedic  No limitations of motion  feet .  No crepitus or effusions noted.  No bony pathology or digital deformities noted.  HAV  B/L  Contracted toes 2-5  B/L.  Skin  normotropic skin with no porokeratosis noted bilaterally.  No signs of infections or ulcers noted.     Onychomycosis  Nails  B/L.  Pain in right toes  Pain in left toes  Debridement of nails both feet followed trimming the nails with dremel tool.  Iatrogenic lesion left hallux due to pincer toenail correction.  RTC 3 months.   Helane Gunther DPM

## 2022-05-18 ENCOUNTER — Encounter: Payer: Self-pay | Admitting: Podiatry

## 2022-05-18 ENCOUNTER — Ambulatory Visit (INDEPENDENT_AMBULATORY_CARE_PROVIDER_SITE_OTHER): Payer: Medicare Other | Admitting: Podiatry

## 2022-05-18 DIAGNOSIS — L608 Other nail disorders: Secondary | ICD-10-CM

## 2022-05-18 DIAGNOSIS — B351 Tinea unguium: Secondary | ICD-10-CM

## 2022-05-18 DIAGNOSIS — M79675 Pain in left toe(s): Secondary | ICD-10-CM

## 2022-05-18 DIAGNOSIS — M79674 Pain in right toe(s): Secondary | ICD-10-CM | POA: Diagnosis not present

## 2022-05-18 NOTE — Progress Notes (Signed)
This patient presents to the office with chief complaint of long thick painful nails.  Patient says the nails are painful walking and wearing shoes.  This patient is unable to self treat.  This patient is unable to trim her nails since she is unable to reach her nails.  She presents to the office for preventative foot care services. She presents to the office with her daughter.  She is seen in her wheelchair.  General Appearance  Alert, conversant and in no acute stress.  Vascular  Dorsalis pedis and posterior tibial  pulses are weakly  palpable  bilaterally.  Capillary return is within normal limits  bilaterally. Temperature is within normal limits  bilaterally.  Neurologic  Senn-Weinstein monofilament wire test within normal limits  bilaterally. Muscle power within normal limits bilaterally.  Nails Thick disfigured discolored nails with subungual debris  from hallux to fifth toes bilaterally. No evidence of bacterial infection or drainage bilaterally.  Pincer nails  B/L.  Orthopedic  No limitations of motion  feet .  No crepitus or effusions noted.  No bony pathology or digital deformities noted.  HAV  B/L  Contracted toes 2-5  B/L.  Skin  normotropic skin with no porokeratosis noted bilaterally.  No signs of infections or ulcers noted.     Onychomycosis  Nails  B/L.  Pain in right toes  Pain in left toes  Debridement of nails both feet followed trimming the nails with dremel tool.  Iatrogenic lesion left hallux due to pincer toenail correction.  RTC 10 weeks    Tyger Oka DPM  

## 2022-07-27 ENCOUNTER — Ambulatory Visit (INDEPENDENT_AMBULATORY_CARE_PROVIDER_SITE_OTHER): Payer: Medicare Other | Admitting: Podiatry

## 2022-07-27 ENCOUNTER — Encounter: Payer: Self-pay | Admitting: Podiatry

## 2022-07-27 DIAGNOSIS — L608 Other nail disorders: Secondary | ICD-10-CM

## 2022-07-27 DIAGNOSIS — M79675 Pain in left toe(s): Secondary | ICD-10-CM

## 2022-07-27 DIAGNOSIS — M79674 Pain in right toe(s): Secondary | ICD-10-CM

## 2022-07-27 DIAGNOSIS — B351 Tinea unguium: Secondary | ICD-10-CM

## 2022-07-27 NOTE — Progress Notes (Signed)
This patient presents to the office with chief complaint of long thick painful nails.  Patient says the nails are painful walking and wearing shoes.  This patient is unable to self treat.  This patient is unable to trim her nails since she is unable to reach her nails.  She presents to the office for preventative foot care services. She presents to the office with her daughter.  She is seen in her wheelchair.  General Appearance  Alert, conversant and in no acute stress.  Vascular  Dorsalis pedis and posterior tibial  pulses are weakly  palpable  bilaterally.  Capillary return is within normal limits  bilaterally. Temperature is within normal limits  bilaterally.  Neurologic  Senn-Weinstein monofilament wire test within normal limits  bilaterally. Muscle power within normal limits bilaterally.  Nails Thick disfigured discolored nails with subungual debris  from hallux to fifth toes bilaterally. No evidence of bacterial infection or drainage bilaterally.  Pincer nails  B/L.  Orthopedic  No limitations of motion  feet .  No crepitus or effusions noted.  No bony pathology or digital deformities noted.  HAV  B/L  Contracted toes 2-5  B/L.  Skin  normotropic skin with no porokeratosis noted bilaterally.  No signs of infections or ulcers noted.     Onychomycosis  Nails  B/L.  Pain in right toes  Pain in left toes  Debridement of nails both feet followed trimming the nails with dremel tool.  Iatrogenic lesion left hallux due to pincer toenail correction.  RTC 10 weeks    Helane Gunther DPM

## 2022-09-27 DIAGNOSIS — M79604 Pain in right leg: Secondary | ICD-10-CM | POA: Diagnosis not present

## 2022-09-27 DIAGNOSIS — M1991 Primary osteoarthritis, unspecified site: Secondary | ICD-10-CM | POA: Diagnosis not present

## 2022-10-05 DIAGNOSIS — F02A4 Dementia in other diseases classified elsewhere, mild, with anxiety: Secondary | ICD-10-CM | POA: Diagnosis not present

## 2022-10-05 DIAGNOSIS — J069 Acute upper respiratory infection, unspecified: Secondary | ICD-10-CM | POA: Diagnosis not present

## 2022-10-05 DIAGNOSIS — R051 Acute cough: Secondary | ICD-10-CM | POA: Diagnosis not present

## 2022-10-05 DIAGNOSIS — G301 Alzheimer's disease with late onset: Secondary | ICD-10-CM | POA: Diagnosis not present

## 2022-10-06 DIAGNOSIS — D649 Anemia, unspecified: Secondary | ICD-10-CM | POA: Diagnosis not present

## 2022-10-06 DIAGNOSIS — N182 Chronic kidney disease, stage 2 (mild): Secondary | ICD-10-CM | POA: Diagnosis not present

## 2022-10-06 DIAGNOSIS — J069 Acute upper respiratory infection, unspecified: Secondary | ICD-10-CM | POA: Diagnosis not present

## 2022-10-06 DIAGNOSIS — I129 Hypertensive chronic kidney disease with stage 1 through stage 4 chronic kidney disease, or unspecified chronic kidney disease: Secondary | ICD-10-CM | POA: Diagnosis not present

## 2022-10-06 DIAGNOSIS — I517 Cardiomegaly: Secondary | ICD-10-CM | POA: Diagnosis not present

## 2022-10-11 DIAGNOSIS — K219 Gastro-esophageal reflux disease without esophagitis: Secondary | ICD-10-CM | POA: Diagnosis not present

## 2022-10-11 DIAGNOSIS — Z0000 Encounter for general adult medical examination without abnormal findings: Secondary | ICD-10-CM | POA: Diagnosis not present

## 2022-10-11 DIAGNOSIS — N182 Chronic kidney disease, stage 2 (mild): Secondary | ICD-10-CM | POA: Diagnosis not present

## 2022-10-11 DIAGNOSIS — D649 Anemia, unspecified: Secondary | ICD-10-CM | POA: Diagnosis not present

## 2022-10-11 DIAGNOSIS — I129 Hypertensive chronic kidney disease with stage 1 through stage 4 chronic kidney disease, or unspecified chronic kidney disease: Secondary | ICD-10-CM | POA: Diagnosis not present

## 2022-10-11 DIAGNOSIS — M1991 Primary osteoarthritis, unspecified site: Secondary | ICD-10-CM | POA: Diagnosis not present

## 2022-10-11 DIAGNOSIS — J069 Acute upper respiratory infection, unspecified: Secondary | ICD-10-CM | POA: Diagnosis not present

## 2022-10-17 DIAGNOSIS — G301 Alzheimer's disease with late onset: Secondary | ICD-10-CM | POA: Diagnosis not present

## 2022-10-17 DIAGNOSIS — M1991 Primary osteoarthritis, unspecified site: Secondary | ICD-10-CM | POA: Diagnosis not present

## 2022-10-18 DIAGNOSIS — H9113 Presbycusis, bilateral: Secondary | ICD-10-CM | POA: Diagnosis not present

## 2022-10-18 DIAGNOSIS — F02A4 Dementia in other diseases classified elsewhere, mild, with anxiety: Secondary | ICD-10-CM | POA: Diagnosis not present

## 2022-10-18 DIAGNOSIS — R410 Disorientation, unspecified: Secondary | ICD-10-CM | POA: Diagnosis not present

## 2022-10-18 DIAGNOSIS — G301 Alzheimer's disease with late onset: Secondary | ICD-10-CM | POA: Diagnosis not present

## 2022-10-25 DIAGNOSIS — H9113 Presbycusis, bilateral: Secondary | ICD-10-CM | POA: Diagnosis not present

## 2022-10-25 DIAGNOSIS — G301 Alzheimer's disease with late onset: Secondary | ICD-10-CM | POA: Diagnosis not present

## 2022-10-25 DIAGNOSIS — F02A4 Dementia in other diseases classified elsewhere, mild, with anxiety: Secondary | ICD-10-CM | POA: Diagnosis not present

## 2022-10-25 DIAGNOSIS — J069 Acute upper respiratory infection, unspecified: Secondary | ICD-10-CM | POA: Diagnosis not present

## 2022-11-02 ENCOUNTER — Encounter: Payer: Self-pay | Admitting: Podiatry

## 2022-11-02 ENCOUNTER — Ambulatory Visit: Payer: Medicare Other | Admitting: Podiatry

## 2022-11-02 VITALS — BP 109/56 | HR 84

## 2022-11-02 DIAGNOSIS — M79675 Pain in left toe(s): Secondary | ICD-10-CM | POA: Diagnosis not present

## 2022-11-02 DIAGNOSIS — M79674 Pain in right toe(s): Secondary | ICD-10-CM | POA: Diagnosis not present

## 2022-11-02 DIAGNOSIS — L608 Other nail disorders: Secondary | ICD-10-CM | POA: Diagnosis not present

## 2022-11-02 DIAGNOSIS — B351 Tinea unguium: Secondary | ICD-10-CM | POA: Diagnosis not present

## 2022-11-02 NOTE — Progress Notes (Signed)
This patient presents to the office with chief complaint of long thick painful nails.  Patient says the nails are painful walking and wearing shoes.  This patient is unable to self treat.  This patient is unable to trim her nails since she is unable to reach her nails.  She presents to the office for preventative foot care services. She presents to the office with her daughter.  She is seen in her wheelchair.  General Appearance  Alert, conversant and in no acute stress.  Vascular  Dorsalis pedis and posterior tibial  pulses are weakly  palpable  bilaterally.  Capillary return is within normal limits  bilaterally. Temperature is within normal limits  bilaterally.  Neurologic  Senn-Weinstein monofilament wire test within normal limits  bilaterally. Muscle power within normal limits bilaterally.  Nails Thick disfigured discolored nails with subungual debris  from hallux to fifth toes bilaterally. No evidence of bacterial infection or drainage bilaterally.  Pincer nails  B/L.  Orthopedic  No limitations of motion  feet .  No crepitus or effusions noted.  No bony pathology or digital deformities noted.  HAV  B/L  Contracted toes 2-5  B/L.  Skin  normotropic skin with no porokeratosis noted bilaterally.  No signs of infections or ulcers noted.     Onychomycosis  Nails  B/L.  Pain in right toes  Pain in left toes  Debridement of nails both feet followed trimming the nails with dremel tool.  Iatrogenic lesion left hallux due to pincer toenail correction.  RTC 12 weeks    Gardiner Barefoot DPM

## 2022-11-07 DIAGNOSIS — M1991 Primary osteoarthritis, unspecified site: Secondary | ICD-10-CM | POA: Diagnosis not present

## 2022-11-07 DIAGNOSIS — G301 Alzheimer's disease with late onset: Secondary | ICD-10-CM | POA: Diagnosis not present

## 2022-11-08 DIAGNOSIS — G301 Alzheimer's disease with late onset: Secondary | ICD-10-CM | POA: Diagnosis not present

## 2022-11-08 DIAGNOSIS — N182 Chronic kidney disease, stage 2 (mild): Secondary | ICD-10-CM | POA: Diagnosis not present

## 2022-11-08 DIAGNOSIS — F02A4 Dementia in other diseases classified elsewhere, mild, with anxiety: Secondary | ICD-10-CM | POA: Diagnosis not present

## 2022-11-08 DIAGNOSIS — I129 Hypertensive chronic kidney disease with stage 1 through stage 4 chronic kidney disease, or unspecified chronic kidney disease: Secondary | ICD-10-CM | POA: Diagnosis not present

## 2022-12-06 DIAGNOSIS — G301 Alzheimer's disease with late onset: Secondary | ICD-10-CM | POA: Diagnosis not present

## 2022-12-06 DIAGNOSIS — I129 Hypertensive chronic kidney disease with stage 1 through stage 4 chronic kidney disease, or unspecified chronic kidney disease: Secondary | ICD-10-CM | POA: Diagnosis not present

## 2022-12-06 DIAGNOSIS — N182 Chronic kidney disease, stage 2 (mild): Secondary | ICD-10-CM | POA: Diagnosis not present

## 2022-12-06 DIAGNOSIS — F02A4 Dementia in other diseases classified elsewhere, mild, with anxiety: Secondary | ICD-10-CM | POA: Diagnosis not present

## 2022-12-13 DIAGNOSIS — R600 Localized edema: Secondary | ICD-10-CM | POA: Diagnosis not present

## 2022-12-13 DIAGNOSIS — H9113 Presbycusis, bilateral: Secondary | ICD-10-CM | POA: Diagnosis not present

## 2022-12-13 DIAGNOSIS — R269 Unspecified abnormalities of gait and mobility: Secondary | ICD-10-CM | POA: Diagnosis not present

## 2022-12-13 DIAGNOSIS — S80921A Unspecified superficial injury of right lower leg, initial encounter: Secondary | ICD-10-CM | POA: Diagnosis not present

## 2022-12-16 DIAGNOSIS — I82411 Acute embolism and thrombosis of right femoral vein: Secondary | ICD-10-CM | POA: Diagnosis not present

## 2022-12-17 ENCOUNTER — Emergency Department
Admission: EM | Admit: 2022-12-17 | Discharge: 2022-12-17 | Disposition: A | Discharge status: Home / Self Care | Payer: Medicare Other | Attending: Emergency Medicine | Admitting: Emergency Medicine

## 2022-12-17 ENCOUNTER — Other Ambulatory Visit: Payer: Self-pay

## 2022-12-17 ENCOUNTER — Emergency Department: Payer: Medicare Other

## 2022-12-17 DIAGNOSIS — R600 Localized edema: Secondary | ICD-10-CM | POA: Insufficient documentation

## 2022-12-17 DIAGNOSIS — R2243 Localized swelling, mass and lump, lower limb, bilateral: Secondary | ICD-10-CM | POA: Diagnosis not present

## 2022-12-17 DIAGNOSIS — M1612 Unilateral primary osteoarthritis, left hip: Secondary | ICD-10-CM | POA: Diagnosis not present

## 2022-12-17 DIAGNOSIS — M7122 Synovial cyst of popliteal space [Baker], left knee: Secondary | ICD-10-CM | POA: Diagnosis not present

## 2022-12-17 DIAGNOSIS — M7989 Other specified soft tissue disorders: Secondary | ICD-10-CM | POA: Diagnosis not present

## 2022-12-17 DIAGNOSIS — I10 Essential (primary) hypertension: Secondary | ICD-10-CM | POA: Diagnosis not present

## 2022-12-17 LAB — COMPREHENSIVE METABOLIC PANEL
ALT: 10 U/L (ref 0–44)
AST: 22 U/L (ref 15–41)
Albumin: 3.4 g/dL — ABNORMAL LOW (ref 3.5–5.0)
Alkaline Phosphatase: 68 U/L (ref 38–126)
Anion gap: 9 (ref 5–15)
BUN: 20 mg/dL (ref 8–23)
CO2: 25 mmol/L (ref 22–32)
Calcium: 8.6 mg/dL — ABNORMAL LOW (ref 8.9–10.3)
Chloride: 104 mmol/L (ref 98–111)
Creatinine, Ser: 0.66 mg/dL (ref 0.44–1.00)
GFR, Estimated: >60 mL/min (ref >60)
Glucose, Bld: 154 mg/dL — ABNORMAL HIGH (ref 70–99)
Potassium: 3.3 mmol/L — ABNORMAL LOW (ref 3.5–5.1)
Sodium: 138 mmol/L (ref 135–145)
Total Bilirubin: 0.6 mg/dL (ref 0.3–1.2)
Total Protein: 6.6 g/dL (ref 6.5–8.1)

## 2022-12-17 LAB — CBC WITH DIFFERENTIAL/PLATELET
Abs Immature Granulocytes: 0.01 10*3/uL (ref 0.00–0.07)
Basophils Absolute: 0.1 10*3/uL (ref 0.0–0.1)
Basophils Relative: 1 %
Eosinophils Absolute: 0.2 10*3/uL (ref 0.0–0.5)
Eosinophils Relative: 2 %
HCT: 29.7 % — ABNORMAL LOW (ref 36.0–46.0)
Hemoglobin: 9.8 g/dL — ABNORMAL LOW (ref 12.0–15.0)
Immature Granulocytes: 0 %
Lymphocytes Relative: 13 %
Lymphs Abs: 0.8 10*3/uL (ref 0.7–4.0)
MCH: 31.9 pg (ref 26.0–34.0)
MCHC: 33 g/dL (ref 30.0–36.0)
MCV: 96.7 fL (ref 80.0–100.0)
Monocytes Absolute: 0.4 10*3/uL (ref 0.1–1.0)
Monocytes Relative: 7 %
Neutro Abs: 4.7 10*3/uL (ref 1.7–7.7)
Neutrophils Relative %: 77 %
Platelets: 314 10*3/uL (ref 150–400)
RBC: 3.07 MIL/uL — ABNORMAL LOW (ref 3.87–5.11)
RDW: 18.1 % — ABNORMAL HIGH (ref 11.5–15.5)
WBC: 6.1 10*3/uL (ref 4.0–10.5)
nRBC: 0 % (ref 0.0–0.2)

## 2022-12-17 LAB — TROPONIN I (HIGH SENSITIVITY)
Troponin I (High Sensitivity): 12 ng/L (ref <18)
Troponin I (High Sensitivity): 10 ng/L (ref <18)

## 2022-12-17 LAB — BRAIN NATRIURETIC PEPTIDE: B Natriuretic Peptide: 455 pg/mL — ABNORMAL HIGH (ref 0.0–100.0)

## 2022-12-17 MED ORDER — ACETAMINOPHEN 325 MG PO TABS
650.0000 mg | ORAL_TABLET | Freq: Once | ORAL | Status: AC
  Administered 2022-12-17: 650 mg via ORAL
  Filled 2022-12-17: qty 2

## 2022-12-17 NOTE — ED Notes (Signed)
US at bedside

## 2022-12-17 NOTE — ED Triage Notes (Signed)
Daughter brought pt in with concern of Left leg DVT. Daughter states the leg is more swollen than normal for the last week. Per daughter, pt is not on blood thinners.   Pt is from [place].

## 2022-12-17 NOTE — ED Notes (Signed)
Pt family member stating pt was complaining of pain in Right hip. Family explains she has bone on bone in that right hip that causes her pain. Family also states "she's sleeping now so I think she finally got comfortable." Will cont to monitor.

## 2022-12-17 NOTE — ED Provider Notes (Signed)
[facility] Provider Note   Event Date/Time   First MD Initiated Contact with Patient 12/17/22 [phone]     (approximate) History  Leg Swelling  HPI Rebecca Michael is a 89 y.o. female who presents from her nursing home with concerns for left lower extremity edema over the last week.  Family at bedside also notes that patient has bruising over bilateral anterior shins that she has received from hitting her walker with her legs.  Per patient's family, she has not on any anticoagulation and has no known history of blood clots. ROS: Unable to assess   Physical Exam  Triage Vital Signs: ED Triage Vitals  Enc Vitals Group     BP 12/17/22 0934 (!) 152/104     Pulse Rate 12/17/22 0934 (!) 101     Resp 12/17/22 0934 18     Temp --      Temp src --      SpO2 12/17/22 0934 99 %     Weight 12/17/22 0933 95 lb (43.1 kg)     Height --      Head Circumference --      Peak Flow --      Pain Score --      Pain Loc --      Pain Edu? --      Excl. in [place]? --    Most recent vital signs: Vitals:   12/17/22 1000 12/17/22 1100  BP: (!) 152/88 121/65  Pulse: 69 73  Resp: (!) 21 20  Temp:    SpO2: 99% 98%   General: Awake, cooperative, confused CV:  Good peripheral perfusion.  Resp:  Normal effort.  Abd:  No distention.  Other:  Elderly overweight Caucasian female laying in bed in no acute distress ED Results / Procedures / Treatments  Labs (all labs ordered are listed, but only abnormal results are displayed) Labs Reviewed  COMPREHENSIVE METABOLIC PANEL - Abnormal; Notable for the following components:      Result Value   Potassium 3.3 (*)    Glucose, Bld 154 (*)    Calcium 8.6 (*)    Albumin 3.4 (*)    All other components within normal limits  BRAIN NATRIURETIC PEPTIDE - Abnormal; Notable for the following components:   B Natriuretic Peptide 455.0 (*)    All other components within normal limits  CBC WITH DIFFERENTIAL/PLATELET - Abnormal; Notable for the  following components:   RBC 3.07 (*)    Hemoglobin 9.8 (*)    HCT 29.7 (*)    RDW 18.1 (*)    All other components within normal limits  TROPONIN I (HIGH SENSITIVITY)  TROPONIN I (HIGH SENSITIVITY)   EKG ED ECG REPORT I, Naaman Plummer, the attending physician, personally viewed and interpreted this ECG. Date: 12/17/2022 EKG Time: 0939 Rate: 74 Rhythm: normal sinus rhythm QRS Axis: normal Intervals: normal ST/T Wave abnormalities: normal Narrative Interpretation: no evidence of acute ischemia RADIOLOGY ED MD interpretation: X-ray of the left hip interpreted independently by me and shows osteoarthritis with advanced arthropathy of the right hip progressive from 2018  Doppler ultrasound of the left lower extremity shows no evidence of DVT however there is evidence of a small Baker's cyst -Agree with radiology assessment Official radiology report(s): DG Hip Unilat With Pelvis 2-3 Views Left  Result Date: 12/17/2022 CLINICAL DATA:  89 year old with pain with any movement. EXAM: DG HIP (WITH OR WITHOUT PELVIS) 2-3V LEFT COMPARISON:  Pelvis radiograph 07/31/2017 FINDINGS: Bones are  subjectively under mineralized. There is mild osteoarthritis of the left hip with joint space narrowing and acetabular spurring. No evidence of fracture, erosion or avascular necrosis. Bony pelvis is grossly intact. Advanced and progressive arthropathy of the right hip with complete joint space loss, subchondral cystic change and sclerosis and remodeling/flattening of the femoral head. Significant progression from 2018 exam. IMPRESSION: 1. Mild left hip osteoarthritis.  No acute radiographic abnormality. 2. Advanced arthropathy of the right hip, progressive from 2018. Findings may be sequela of avascular necrosis, significant progression in osteoarthritis, or inflammatory arthropathy. Electronically Signed   By: Keith Rake M.D.   On: 12/17/2022 12:32   US Venous Img Lower Unilateral Left  Result Date:  12/17/2022 CLINICAL DATA:  One 89 year old female with history of left lower extremity edema. EXAM: LEFT LOWER EXTREMITY VENOUS DOPPLER ULTRASOUND TECHNIQUE: Gray-scale sonography with graded compression, as well as color Doppler and duplex ultrasound were performed to evaluate the lower extremity deep venous systems from the level of the common femoral vein and including the common femoral, femoral, profunda femoral, popliteal and calf veins including the posterior tibial, peroneal and gastrocnemius veins when visible. The superficial great saphenous vein was also interrogated. Spectral Doppler was utilized to evaluate flow at rest and with distal augmentation maneuvers in the common femoral, femoral and popliteal veins. COMPARISON:  None Available. FINDINGS: Contralateral Common Femoral Vein: Not imaged secondary to patient positioning and poor condition per report from the sonographer. Common Femoral Vein: No evidence of thrombus. Normal compressibility, respiratory phasicity and response to augmentation. Saphenofemoral Junction: No evidence of thrombus. Normal compressibility and flow on color Doppler imaging. Profunda Femoral Vein: No evidence of thrombus. Normal compressibility and flow on color Doppler imaging. Femoral Vein: No evidence of thrombus. Normal compressibility, respiratory phasicity and response to augmentation. Popliteal Vein: No evidence of thrombus. Normal compressibility, respiratory phasicity and response to augmentation. Calf Veins: No evidence of thrombus in the posterior tibial vein. Peroneal vein could not be visualized. Superficial Great Saphenous Vein: No evidence of thrombus. Normal compressibility. Venous Reflux:  None. Other Findings: Small fluid collection in the popliteal fossa measuring 2.3 x 0.7 x 1.4 cm, likely a small Baker's cyst. IMPRESSION: 1. Despite the mild limitations of today's examination, there is no evidence of deep venous thrombosis in the visualized left lower  extremity, as above. Electronically Signed   By: Vinnie Langton M.D.   On: 12/17/2022 12:03   PROCEDURES: Critical Care performed: No .1-3 Lead EKG Interpretation  Performed by: Naaman Plummer, MD Authorized by: Naaman Plummer, MD     Interpretation: normal     ECG rate:  71   ECG rate assessment: normal     Rhythm: sinus rhythm     Ectopy: none     Conduction: normal    MEDICATIONS ORDERED IN ED: Medications  acetaminophen (TYLENOL) tablet 650 mg (650 mg Oral Given 12/17/22 1135)   IMPRESSION / MDM / ASSESSMENT AND PLAN / ED COURSE  I reviewed the triage vital signs and the nursing notes.                             The patient is on the cardiac monitor to evaluate for evidence of arrhythmia and/or significant heart rate changes. Patient's presentation is most consistent with acute presentation with potential threat to life or bodily function. 89 year old female presents for left lower extremity edema and pain Given history, exam and workup I have  low suspicion for fracture, dislocation, significant ligamentous injury, septic arthritis, gout flare, new autoimmune arthropathy, or gonococcal arthropathy.  Interventions: X-ray and Doppler ultrasound of the left lower extremity shows no evidence of acute abnormalities Disposition: Discharge home with strict return precautions and instructions for prompt primary care follow up in the next week.   FINAL CLINICAL IMPRESSION(S) / ED DIAGNOSES   Final diagnoses:  Leg swelling  Baker's cyst of knee, left   Rx / DC Orders   ED Discharge Orders     None      Note:  This document was prepared using Dragon voice recognition software and may include unintentional dictation errors.   Naaman Plummer, MD 12/17/22 [phone]

## 2022-12-17 NOTE — ED Notes (Signed)
Pt complaining of Right hip pain. Dr. Cheri Fowler informed.

## 2022-12-27 DIAGNOSIS — M79662 Pain in left lower leg: Secondary | ICD-10-CM | POA: Diagnosis not present

## 2022-12-27 DIAGNOSIS — F02A4 Dementia in other diseases classified elsewhere, mild, with anxiety: Secondary | ICD-10-CM | POA: Diagnosis not present

## 2022-12-27 DIAGNOSIS — G301 Alzheimer's disease with late onset: Secondary | ICD-10-CM | POA: Diagnosis not present

## 2022-12-27 DIAGNOSIS — R600 Localized edema: Secondary | ICD-10-CM | POA: Diagnosis not present

## 2023-01-03 DIAGNOSIS — R600 Localized edema: Secondary | ICD-10-CM | POA: Diagnosis not present

## 2023-01-03 DIAGNOSIS — K5901 Slow transit constipation: Secondary | ICD-10-CM | POA: Diagnosis not present

## 2023-01-03 DIAGNOSIS — F02A4 Dementia in other diseases classified elsewhere, mild, with anxiety: Secondary | ICD-10-CM | POA: Diagnosis not present

## 2023-01-03 DIAGNOSIS — G301 Alzheimer's disease with late onset: Secondary | ICD-10-CM | POA: Diagnosis not present

## 2023-01-04 DIAGNOSIS — G301 Alzheimer's disease with late onset: Secondary | ICD-10-CM | POA: Diagnosis not present

## 2023-01-04 DIAGNOSIS — M1991 Primary osteoarthritis, unspecified site: Secondary | ICD-10-CM | POA: Diagnosis not present

## 2023-01-26 ENCOUNTER — Ambulatory Visit (INDEPENDENT_AMBULATORY_CARE_PROVIDER_SITE_OTHER): Payer: Medicare Other | Admitting: Nurse Practitioner

## 2023-01-26 ENCOUNTER — Encounter (INDEPENDENT_AMBULATORY_CARE_PROVIDER_SITE_OTHER): Payer: Self-pay | Admitting: Nurse Practitioner

## 2023-01-26 VITALS — BP 128/73 | HR 103 | Resp 18

## 2023-01-26 DIAGNOSIS — K5900 Constipation, unspecified: Secondary | ICD-10-CM | POA: Diagnosis not present

## 2023-01-26 DIAGNOSIS — I890 Lymphedema, not elsewhere classified: Secondary | ICD-10-CM | POA: Diagnosis not present

## 2023-01-26 DIAGNOSIS — I10 Essential (primary) hypertension: Secondary | ICD-10-CM | POA: Diagnosis not present

## 2023-01-26 DIAGNOSIS — M6281 Muscle weakness (generalized): Secondary | ICD-10-CM | POA: Diagnosis not present

## 2023-01-26 DIAGNOSIS — K219 Gastro-esophageal reflux disease without esophagitis: Secondary | ICD-10-CM | POA: Diagnosis not present

## 2023-01-27 ENCOUNTER — Encounter (INDEPENDENT_AMBULATORY_CARE_PROVIDER_SITE_OTHER): Payer: Self-pay

## 2023-01-30 NOTE — Telephone Encounter (Signed)
Made POA aware order has been faxed over to [facility]

## 2023-01-30 NOTE — Telephone Encounter (Signed)
Rx on your desk  

## 2023-01-30 NOTE — Telephone Encounter (Signed)
Can we call Rebecca Michael to clarify if the nursing home will allow her to bring compression socks to put on or if they need an Rx to supply her with compression socks.  Also, do they need an order to place the socks

## 2023-01-31 DIAGNOSIS — F02B4 Dementia in other diseases classified elsewhere, moderate, with anxiety: Secondary | ICD-10-CM | POA: Diagnosis not present

## 2023-01-31 DIAGNOSIS — N182 Chronic kidney disease, stage 2 (mild): Secondary | ICD-10-CM | POA: Diagnosis not present

## 2023-01-31 DIAGNOSIS — D649 Anemia, unspecified: Secondary | ICD-10-CM | POA: Diagnosis not present

## 2023-01-31 DIAGNOSIS — G301 Alzheimer's disease with late onset: Secondary | ICD-10-CM | POA: Diagnosis not present

## 2023-02-05 ENCOUNTER — Ambulatory Visit: Payer: Medicare Other | Admitting: Podiatry

## 2023-02-05 ENCOUNTER — Encounter: Payer: Self-pay | Admitting: Podiatry

## 2023-02-05 DIAGNOSIS — M79674 Pain in right toe(s): Secondary | ICD-10-CM

## 2023-02-05 DIAGNOSIS — M79675 Pain in left toe(s): Secondary | ICD-10-CM

## 2023-02-05 DIAGNOSIS — B351 Tinea unguium: Secondary | ICD-10-CM

## 2023-02-05 NOTE — Progress Notes (Signed)
  Subjective:  Patient ID: Rebecca Michael, female    DOB: 1920-05-25,  MRN: [id number]  Rebecca Michael presents to clinic today for painful elongated mycotic toenails 1-5 bilaterally which are tender when wearing enclosed shoe gear. Pain is relieved with periodic professional debridement. She is accompanied by her daughter on today's visit. Patient is a resident of [facility]  Chief Complaint  Patient presents with   Nail Problem    RFC,Referring Provider Smiley Houseman, NP,LOV:last week-05/24      New problem(s): None.   PCP is Smiley Houseman, NP.  Allergies  Allergen Reactions   Amoxicillin    Fish Allergy    Iodine    Latex     Review of Systems: Negative except as noted in the HPI.  Objective: No changes noted in today's physical examination. There were no vitals filed for this visit. Rebecca Michael is a pleasant 89 y.o. female thin build in NAD. AAO x 3.  Vascular Examination: CFT <3 seconds b/l. DP/PT pulses faintly palpable b/l. Skin temperature gradient warm to warm b/l. No pain with calf compression. No ischemia or gangrene. No cyanosis or clubbing noted b/l. Pedal hair absent.   Neurological Examination: Sensation grossly intact b/l with 10 gram monofilament. Vibratory sensation intact b/l.   Dermatological Examination: Pedal skin warm and supple b/l.   No open wounds. No interdigital macerations.  Toenails 1-5 b/l thick, discolored, elongated with subungual debris and pain on dorsal palpation.    No hyperkeratotic nor porokeratotic lesions present on today's visit.  Musculoskeletal Examination: Muscle strength 4/5 to all lower extremity muscle groups bilaterally. No pain, crepitus or joint limitation noted with ROM bilateral LE. Hammertoe deformity noted 2-5 b/l. Utilizes transport chair for mobility assistance.  Radiographs: None  Assessment/Plan: 1. Pain due to onychomycosis of toenails of both feet    -Patient's family  member present. All questions/concerns addressed on today's visit. -Consent given for treatment as described below: -Facility to continue fall precautions and pressure precautions. -Patient to continue soft, supportive shoe gear daily. -Toenails 1-5 b/l were debrided in length and girth with sterile nail nippers and dremel without iatrogenic bleeding.  -Patient/POA to call should there be question/concern in the interim.   Return in about 3 months (around 05/08/2023).  Freddie Breech, DPM

## 2023-02-07 DIAGNOSIS — K921 Melena: Secondary | ICD-10-CM | POA: Diagnosis not present

## 2023-02-07 DIAGNOSIS — G301 Alzheimer's disease with late onset: Secondary | ICD-10-CM | POA: Diagnosis not present

## 2023-02-07 DIAGNOSIS — R0981 Nasal congestion: Secondary | ICD-10-CM | POA: Diagnosis not present

## 2023-02-07 DIAGNOSIS — F02B4 Dementia in other diseases classified elsewhere, moderate, with anxiety: Secondary | ICD-10-CM | POA: Diagnosis not present

## 2023-02-13 ENCOUNTER — Encounter (INDEPENDENT_AMBULATORY_CARE_PROVIDER_SITE_OTHER): Payer: Self-pay | Admitting: Nurse Practitioner

## 2023-02-13 NOTE — Progress Notes (Signed)
Subjective:    Patient ID: Rebecca Michael, female    DOB: 06-22-1920, 89 y.o.   MRN: [id number] Chief Complaint  Patient presents with   Establish Care    The patient is a 89 year old female that presents today after having an extended period of left lower extremity swelling.  The patient is used outpatient and this has been helpful.  There is some underlying confusion the daughter provides much of the history today.  The patient is also hard of hearing.  The patient has not been very tolerant to compression.  It is also difficult to have her elevate consistently.  Currently there is no open wounds or ulcerations.  There is also no weeping.    Review of Systems  Cardiovascular:  Positive for leg swelling.  All other systems reviewed and are negative.      Objective:   Physical Exam Vitals reviewed.  HENT:     Head: Normocephalic.  Cardiovascular:     Rate and Rhythm: Normal rate.  Pulmonary:     Effort: Pulmonary effort is normal.  Musculoskeletal:     Left lower leg: Edema present.  Skin:    General: Skin is warm and dry.  Neurological:     Mental Status: She is alert and oriented to person, place, and time.  Psychiatric:        Mood and Affect: Mood normal.        Behavior: Behavior normal.        Thought Content: Thought content normal.        Judgment: Judgment normal.     BP 128/73 (BP Location: Right Arm)   Pulse (!) 103   Resp 18   History reviewed. No pertinent past medical history.  Social History   Socioeconomic History   Marital status: Divorced    Spouse name: Not on file   Number of children: Not on file   Years of education: Not on file   Highest education level: Not on file  Occupational History   Not on file  Tobacco Use   Smoking status: Former   Smokeless tobacco: Never  Substance and Sexual Activity   Alcohol use: Not Currently    Alcohol/week: 1.0 standard drink of alcohol    Types: 1 Glasses of wine per week    Comment: 1/2  glass/day with lunch   Drug use: Never   Sexual activity: Not on file  Other Topics Concern   Not on file  Social History Narrative   Not on file   Social Determinants of Health   Financial Resource Strain: Not on file  Food Insecurity: Not on file  Transportation Needs: Not on file  Physical Activity: Not on file  Stress: Not on file  Social Connections: Not on file  Intimate Partner Violence: Not on file    History reviewed. No pertinent surgical history.  History reviewed. No pertinent family history.  Allergies  Allergen Reactions   Amoxicillin    Fish Allergy    Iodine    Latex        Latest Ref Rng & Units 12/17/2022    9:38 AM  CBC  WBC 4.0 - 10.5 K/uL 6.1   Hemoglobin 12.0 - 15.0 g/dL 9.8   Hematocrit 40.9 - 46.0 % 29.7   Platelets 150 - 400 K/uL 314       CMP     Component Value Date/Time   NA 138 12/17/2022 0938   K 3.3 (L) 12/17/2022 8119  CL 104 12/17/2022 0938   CO2 25 12/17/2022 0938   GLUCOSE 154 (H) 12/17/2022 0938   BUN 20 12/17/2022 0938   CREATININE 0.66 12/17/2022 0938   CALCIUM 8.6 (L) 12/17/2022 0938   PROT 6.6 12/17/2022 0938   ALBUMIN 3.4 (L) 12/17/2022 0938   AST 22 12/17/2022 0938   ALT 10 12/17/2022 0938   ALKPHOS 68 12/17/2022 0938   BILITOT 0.6 12/17/2022 0938   GFRNONAA >60 12/17/2022 0938     No results found.     Assessment & Plan:   1. Lymphedema I suspect the patient has some lymphedema in the left lower extremity however it could be related to venous insufficiency.  In order to help with the patient's lower extremity edema we will have the patient placed into Unna boots.  We will have this changed weekly.  We will send orders to her nursing facility to have these changed.  She will present in 4 weeks for reevaluation.   Current Outpatient Medications on File Prior to Visit  Medication Sig Dispense Refill   Acetaminophen Extra Strength 500 MG TABS Take 2 tablets by mouth 3 (three) times daily.     celecoxib  (CELEBREX) 200 MG capsule Take 200 mg by mouth at bedtime.     D 1000 25 MCG (1000 UT) capsule Take 1,000 Units by mouth daily. (Patient not taking: Reported on 02/05/2023)     doxazosin (CARDURA) 4 MG tablet      psyllium (REGULOID) 0.52 g capsule Take 0.52 g by mouth daily.     senna (SENOKOT) 8.6 MG TABS tablet Take 1 tablet by mouth.     aspirin 325 MG tablet Take 325 mg by mouth daily. (Patient not taking: Reported on 02/05/2023)     fluticasone (FLONASE) 50 MCG/ACT nasal spray Place 1 spray into both nostrils daily. (Patient not taking: Reported on 02/05/2023)     No current facility-administered medications on file prior to visit.    There are no Patient Instructions on file for this visit. No follow-ups on file.   Georgiana Spinner, NP

## 2023-02-14 DIAGNOSIS — F02B4 Dementia in other diseases classified elsewhere, moderate, with anxiety: Secondary | ICD-10-CM | POA: Diagnosis not present

## 2023-02-14 DIAGNOSIS — J069 Acute upper respiratory infection, unspecified: Secondary | ICD-10-CM | POA: Diagnosis not present

## 2023-02-14 DIAGNOSIS — G301 Alzheimer's disease with late onset: Secondary | ICD-10-CM | POA: Diagnosis not present

## 2023-02-15 DIAGNOSIS — M1991 Primary osteoarthritis, unspecified site: Secondary | ICD-10-CM | POA: Diagnosis not present

## 2023-02-15 DIAGNOSIS — G301 Alzheimer's disease with late onset: Secondary | ICD-10-CM | POA: Diagnosis not present

## 2023-02-21 ENCOUNTER — Other Ambulatory Visit (INDEPENDENT_AMBULATORY_CARE_PROVIDER_SITE_OTHER): Payer: Self-pay | Admitting: Nurse Practitioner

## 2023-02-21 DIAGNOSIS — R600 Localized edema: Secondary | ICD-10-CM

## 2023-02-23 ENCOUNTER — Ambulatory Visit (INDEPENDENT_AMBULATORY_CARE_PROVIDER_SITE_OTHER): Payer: Medicare Other

## 2023-02-23 ENCOUNTER — Encounter (INDEPENDENT_AMBULATORY_CARE_PROVIDER_SITE_OTHER): Payer: Self-pay | Admitting: Nurse Practitioner

## 2023-02-23 ENCOUNTER — Ambulatory Visit (INDEPENDENT_AMBULATORY_CARE_PROVIDER_SITE_OTHER): Payer: Medicare Other | Admitting: Nurse Practitioner

## 2023-02-23 VITALS — BP 152/85 | HR 67 | Resp 18 | Ht 59.0 in | Wt 95.0 lb

## 2023-02-23 DIAGNOSIS — I890 Lymphedema, not elsewhere classified: Secondary | ICD-10-CM | POA: Diagnosis not present

## 2023-02-23 DIAGNOSIS — R600 Localized edema: Secondary | ICD-10-CM | POA: Diagnosis not present

## 2023-02-24 ENCOUNTER — Encounter (INDEPENDENT_AMBULATORY_CARE_PROVIDER_SITE_OTHER): Payer: Self-pay | Admitting: Nurse Practitioner

## 2023-02-24 NOTE — Progress Notes (Signed)
Subjective:    Patient ID: Rebecca Michael, female    DOB: October 12, 1919, 89 y.o.   MRN: [id number] Chief Complaint  Patient presents with   Follow-up    4 week follow up with L le refulx    The patient is a 89 year old female that presents today after having an extended period of left lower extremity swelling.  Initially she was placed into Unna boots but the patient kept on wrapping them so she was transition to medical grade compression socks.  The patient does very well with use of medical grade compression when they are applied.  However the patient is currently in a nursing facility and it is not always consistent and application.  Since her last visit she has had a fall and has some significant facial bruising.  She continues to have some notable left lower extremity swelling and there is no open wounds, ulcers or weeping.  Today noninvasive study showed no evidence of DVT or superficial phlebitis in the left lower extremity.  No evidence of deep venous insufficiency or superficial venous reflux noted.    Review of Systems  HENT:  Positive for hearing loss.   Cardiovascular:  Positive for leg swelling.  Neurological:  Positive for weakness.  Hematological:  Bruises/bleeds easily.  All other systems reviewed and are negative.      Objective:   Physical Exam Vitals reviewed.  HENT:     Head: Normocephalic.  Cardiovascular:     Rate and Rhythm: Normal rate.  Pulmonary:     Effort: Pulmonary effort is normal.  Musculoskeletal:     Left lower leg: 2+ Edema present.  Skin:    General: Skin is warm and dry.  Neurological:     Mental Status: She is alert and oriented to person, place, and time.  Psychiatric:        Mood and Affect: Mood normal.        Behavior: Behavior normal.        Thought Content: Thought content normal.        Judgment: Judgment normal.     BP (!) 152/85 (BP Location: Right Arm)   Pulse 67   Resp 18   Ht 4\' 11"  (1.499 m)   Wt 95 lb (43.1 kg)    BMI 19.19 kg/m   History reviewed. No pertinent past medical history.  Social History   Socioeconomic History   Marital status: Divorced    Spouse name: Not on file   Number of children: Not on file   Years of education: Not on file   Highest education level: Not on file  Occupational History   Not on file  Tobacco Use   Smoking status: Former   Smokeless tobacco: Never  Substance and Sexual Activity   Alcohol use: Not Currently    Alcohol/week: 1.0 standard drink of alcohol    Types: 1 Glasses of wine per week    Comment: 1/2 glass/day with lunch   Drug use: Never   Sexual activity: Not on file  Other Topics Concern   Not on file  Social History Narrative   Not on file   Social Determinants of Health   Financial Resource Strain: Not on file  Food Insecurity: Not on file  Transportation Needs: Not on file  Physical Activity: Not on file  Stress: Not on file  Social Connections: Not on file  Intimate Partner Violence: Not on file    History reviewed. No pertinent surgical history.  History reviewed. No  pertinent family history.  Allergies  Allergen Reactions   Amoxicillin    Fish Allergy    Iodine    Latex        Latest Ref Rng & Units 12/17/2022    9:38 AM  CBC  WBC 4.0 - 10.5 K/uL 6.1   Hemoglobin 12.0 - 15.0 g/dL 9.8   Hematocrit 40.9 - 46.0 % 29.7   Platelets 150 - 400 K/uL 314       CMP     Component Value Date/Time   NA 138 12/17/2022 0938   K 3.3 (L) 12/17/2022 0938   CL 104 12/17/2022 0938   CO2 25 12/17/2022 0938   GLUCOSE 154 (H) 12/17/2022 0938   BUN 20 12/17/2022 0938   CREATININE 0.66 12/17/2022 0938   CALCIUM 8.6 (L) 12/17/2022 0938   PROT 6.6 12/17/2022 0938   ALBUMIN 3.4 (L) 12/17/2022 0938   AST 22 12/17/2022 0938   ALT 10 12/17/2022 0938   ALKPHOS 68 12/17/2022 0938   BILITOT 0.6 12/17/2022 0938   GFRNONAA >60 12/17/2022 0938     No results found.     Assessment & Plan:   1. Lymphedema Over the last 2 weeks  the patient has been doing well when she is able to wear compression.  However she is currently in a nursing facility and that is the largest hindrance and making sure the place them on her on a daily basis.  We discussed about the possible use of lymphedema pump but my fear is that the patient may have tried to leave the bed and severely injured herself, as she has had recent falls.  Based on this we have sent instructions to the nursing facility distress use of compression daily and when it should be worn.  Will plan to have the patient return in 3 months for reevaluation.   Current Outpatient Medications on File Prior to Visit  Medication Sig Dispense Refill   Acetaminophen Extra Strength 500 MG TABS Take 2 tablets by mouth as needed.     cholecalciferol (VITAMIN D3) 25 MCG (1000 UNIT) tablet Take 1,000 Units by mouth daily.     psyllium (REGULOID) 0.52 g capsule Take 0.52 g by mouth daily.     senna (SENOKOT) 8.6 MG TABS tablet Take 1 tablet by mouth.     sertraline (ZOLOFT) 25 MG tablet Take 25 mg by mouth daily.     No current facility-administered medications on file prior to visit.    There are no Patient Instructions on file for this visit. No follow-ups on file.   Georgiana Spinner, NP

## 2023-02-28 DIAGNOSIS — W19XXXA Unspecified fall, initial encounter: Secondary | ICD-10-CM | POA: Diagnosis not present

## 2023-02-28 DIAGNOSIS — E559 Vitamin D deficiency, unspecified: Secondary | ICD-10-CM | POA: Diagnosis not present

## 2023-02-28 DIAGNOSIS — G301 Alzheimer's disease with late onset: Secondary | ICD-10-CM | POA: Diagnosis not present

## 2023-02-28 DIAGNOSIS — R269 Unspecified abnormalities of gait and mobility: Secondary | ICD-10-CM | POA: Diagnosis not present

## 2023-03-02 DIAGNOSIS — M1991 Primary osteoarthritis, unspecified site: Secondary | ICD-10-CM | POA: Diagnosis not present

## 2023-03-02 DIAGNOSIS — G301 Alzheimer's disease with late onset: Secondary | ICD-10-CM | POA: Diagnosis not present

## 2023-04-04 DIAGNOSIS — G301 Alzheimer's disease with late onset: Secondary | ICD-10-CM | POA: Diagnosis not present

## 2023-04-04 DIAGNOSIS — R600 Localized edema: Secondary | ICD-10-CM | POA: Diagnosis not present

## 2023-04-04 DIAGNOSIS — R269 Unspecified abnormalities of gait and mobility: Secondary | ICD-10-CM | POA: Diagnosis not present

## 2023-04-04 DIAGNOSIS — F02C4 Dementia in other diseases classified elsewhere, severe, with anxiety: Secondary | ICD-10-CM | POA: Diagnosis not present

## 2023-04-18 DIAGNOSIS — I10 Essential (primary) hypertension: Secondary | ICD-10-CM | POA: Diagnosis not present

## 2023-04-18 DIAGNOSIS — G301 Alzheimer's disease with late onset: Secondary | ICD-10-CM | POA: Diagnosis not present

## 2023-04-18 DIAGNOSIS — R451 Restlessness and agitation: Secondary | ICD-10-CM | POA: Diagnosis not present

## 2023-04-18 DIAGNOSIS — F02C4 Dementia in other diseases classified elsewhere, severe, with anxiety: Secondary | ICD-10-CM | POA: Diagnosis not present

## 2023-04-18 DIAGNOSIS — D649 Anemia, unspecified: Secondary | ICD-10-CM | POA: Diagnosis not present

## 2023-04-20 DIAGNOSIS — M6281 Muscle weakness (generalized): Secondary | ICD-10-CM | POA: Diagnosis not present

## 2023-04-20 DIAGNOSIS — I10 Essential (primary) hypertension: Secondary | ICD-10-CM | POA: Diagnosis not present

## 2023-04-20 DIAGNOSIS — E559 Vitamin D deficiency, unspecified: Secondary | ICD-10-CM | POA: Diagnosis not present

## 2023-04-20 DIAGNOSIS — K219 Gastro-esophageal reflux disease without esophagitis: Secondary | ICD-10-CM | POA: Diagnosis not present

## 2023-05-08 DIAGNOSIS — F419 Anxiety disorder, unspecified: Secondary | ICD-10-CM | POA: Diagnosis not present

## 2023-05-08 DIAGNOSIS — I739 Peripheral vascular disease, unspecified: Secondary | ICD-10-CM | POA: Diagnosis not present

## 2023-05-08 DIAGNOSIS — G301 Alzheimer's disease with late onset: Secondary | ICD-10-CM | POA: Diagnosis not present

## 2023-05-08 DIAGNOSIS — F02B18 Dementia in other diseases classified elsewhere, moderate, with other behavioral disturbance: Secondary | ICD-10-CM | POA: Diagnosis not present

## 2023-05-14 ENCOUNTER — Encounter: Payer: Self-pay | Admitting: Podiatry

## 2023-05-14 ENCOUNTER — Ambulatory Visit: Payer: Medicare Other | Admitting: Podiatry

## 2023-05-14 DIAGNOSIS — M79674 Pain in right toe(s): Secondary | ICD-10-CM | POA: Diagnosis not present

## 2023-05-14 DIAGNOSIS — B351 Tinea unguium: Secondary | ICD-10-CM

## 2023-05-14 DIAGNOSIS — M79675 Pain in left toe(s): Secondary | ICD-10-CM

## 2023-05-16 DIAGNOSIS — F02B18 Dementia in other diseases classified elsewhere, moderate, with other behavioral disturbance: Secondary | ICD-10-CM | POA: Diagnosis not present

## 2023-05-16 DIAGNOSIS — F419 Anxiety disorder, unspecified: Secondary | ICD-10-CM | POA: Diagnosis not present

## 2023-05-16 DIAGNOSIS — F22 Delusional disorders: Secondary | ICD-10-CM | POA: Diagnosis not present

## 2023-05-16 DIAGNOSIS — G301 Alzheimer's disease with late onset: Secondary | ICD-10-CM | POA: Diagnosis not present

## 2023-05-17 DIAGNOSIS — I10 Essential (primary) hypertension: Secondary | ICD-10-CM | POA: Diagnosis not present

## 2023-05-17 DIAGNOSIS — D649 Anemia, unspecified: Secondary | ICD-10-CM | POA: Diagnosis not present

## 2023-05-19 NOTE — Progress Notes (Signed)
  Subjective:  Patient ID: Rebecca Michael, female    DOB: 12-31-1919,  MRN: [id number]  RANIYA WOOLARD presents to clinic today for painful elongated mycotic toenails 1-5 bilaterally which are tender when wearing enclosed shoe gear. Pain is relieved with periodic professional debridement. She is accompanied by her daughter on today's visit. Daughter states my assistant informs her Mom's stockings and shoes are wet. Patient cannot relate any history regarding this. She is a resident of [address] Chief Complaint  Patient presents with   Nail Problem    RFC, Referring Provider Smiley Houseman, NP,lov:2 weeks ago      New problem(s): None.   PCP is Smiley Houseman, NP.  Allergies  Allergen Reactions   Amoxicillin    Fish Allergy    Iodine    Latex     Review of Systems: Negative except as noted in the HPI.  Objective: No changes noted in today's physical examination. There were no vitals filed for this visit. Rebecca Michael is a pleasant 89 y.o. female thin build in NAD. AAO x 3.  Vascular Examination: CFT <3 seconds b/l. DP/PT pulses faintly palpable b/l. +2 pedal edema b/l. Compression hose in place, but are wet. Skin temperature gradient warm to warm b/l. No pain with calf compression. No ischemia or gangrene. No cyanosis or clubbing noted b/l. Pedal hair absent.   Neurological Examination: Sensation grossly intact b/l with 10 gram monofilament. Vibratory sensation intact b/l.   Dermatological Examination: Pedal skin warm and supple b/l.   No open wounds. No interdigital macerations.  Toenails 1-5 b/l thick, discolored, elongated with subungual debris and pain on dorsal palpation.    No hyperkeratotic nor porokeratotic lesions present on today's visit.  Musculoskeletal Examination: Muscle strength 4/5 to all lower extremity muscle groups bilaterally. No pain, crepitus or joint limitation noted with ROM bilateral LE. Hammertoe deformity noted 2-5 b/l.  Utilizes transport chair for mobility assistance.  Radiographs: None  Assessment/Plan: 1. Pain due to onychomycosis of toenails of both feet     -Patient with h/o dementia/Alzheimer's/cognitive deficit. Patient's family member present. All questions/concerns addressed on today's visit. -Wet compression hose removed. Placed stockinette on both feet for transport back to facility. -Patient to continue soft, supportive shoe gear daily. -Toenails 1-5 b/l were debrided in length and girth with sterile nail nippers and dremel without iatrogenic bleeding.  -Patient/POA to call should there be question/concern in the interim.   Return in about 3 months (around 08/14/2023).  Freddie Breech, DPM

## 2023-05-23 DIAGNOSIS — W19XXXA Unspecified fall, initial encounter: Secondary | ICD-10-CM | POA: Diagnosis not present

## 2023-05-23 DIAGNOSIS — N182 Chronic kidney disease, stage 2 (mild): Secondary | ICD-10-CM | POA: Diagnosis not present

## 2023-05-23 DIAGNOSIS — I739 Peripheral vascular disease, unspecified: Secondary | ICD-10-CM | POA: Diagnosis not present

## 2023-05-23 DIAGNOSIS — I129 Hypertensive chronic kidney disease with stage 1 through stage 4 chronic kidney disease, or unspecified chronic kidney disease: Secondary | ICD-10-CM | POA: Diagnosis not present

## 2023-05-29 ENCOUNTER — Ambulatory Visit (INDEPENDENT_AMBULATORY_CARE_PROVIDER_SITE_OTHER): Payer: Medicare Other | Admitting: Nurse Practitioner

## 2023-05-29 ENCOUNTER — Encounter (INDEPENDENT_AMBULATORY_CARE_PROVIDER_SITE_OTHER): Payer: Self-pay | Admitting: Nurse Practitioner

## 2023-05-29 VITALS — BP 126/72 | HR 69 | Resp 15

## 2023-05-29 DIAGNOSIS — M1611 Unilateral primary osteoarthritis, right hip: Secondary | ICD-10-CM | POA: Diagnosis not present

## 2023-05-29 DIAGNOSIS — R296 Repeated falls: Secondary | ICD-10-CM | POA: Diagnosis not present

## 2023-05-29 DIAGNOSIS — Z515 Encounter for palliative care: Secondary | ICD-10-CM | POA: Diagnosis not present

## 2023-05-29 DIAGNOSIS — I890 Lymphedema, not elsewhere classified: Secondary | ICD-10-CM

## 2023-05-29 DIAGNOSIS — F419 Anxiety disorder, unspecified: Secondary | ICD-10-CM | POA: Diagnosis not present

## 2023-05-29 DIAGNOSIS — Z791 Long term (current) use of non-steroidal anti-inflammatories (NSAID): Secondary | ICD-10-CM | POA: Diagnosis not present

## 2023-05-30 ENCOUNTER — Encounter (INDEPENDENT_AMBULATORY_CARE_PROVIDER_SITE_OTHER): Payer: Self-pay | Admitting: Nurse Practitioner

## 2023-05-30 NOTE — Progress Notes (Signed)
Subjective:    Patient ID: Rebecca Michael, female    DOB: [birth year], 89 y.o.   MRN: [id number] Chief Complaint  Patient presents with   Follow-up    3 month follow up    The patient is a 89 year old female that presents today after having an extended period of left lower extremity swelling.  She returns after being diligent with using compression socks daily at at her facility.  Her right lower extremity has very minimal swelling with left continues to have some at her ankle.  She recently had a fall so there is some notable bruising.  She continues to suffer with right hip pain.  And elevate is much as she is agreeable to as her daughter notes that sometimes there is some difficulty with persuading her to do elevation.  Previous, noninvasive study showed no evidence of DVT or superficial phlebitis in the left lower extremity.  No evidence of deep venous insufficiency or superficial venous reflux noted.    Review of Systems  Cardiovascular:  Positive for leg swelling.  Neurological:  Positive for weakness.  Hematological:  Bruises/bleeds easily.  All other systems reviewed and are negative.      Objective:   Physical Exam Vitals reviewed.  HENT:     Head: Normocephalic.  Cardiovascular:     Rate and Rhythm: Normal rate.  Pulmonary:     Effort: Pulmonary effort is normal.  Musculoskeletal:     Left lower leg: 2+ Edema present.  Skin:    General: Skin is warm and dry.  Neurological:     Mental Status: She is alert and oriented to person, place, and time.     Gait: Gait abnormal.  Psychiatric:        Mood and Affect: Mood normal.        Behavior: Behavior normal.        Thought Content: Thought content normal.        Judgment: Judgment normal.     BP 126/72 (BP Location: Left Arm)   Pulse 69   Resp 15   History reviewed. No pertinent past medical history.  Social History   Socioeconomic History   Marital status: Divorced    Spouse name: Not on file    Number of children: Not on file   Years of education: Not on file   Highest education level: Not on file  Occupational History   Not on file  Tobacco Use   Smoking status: Former   Smokeless tobacco: Never  Substance and Sexual Activity   Alcohol use: Not Currently    Alcohol/week: 1.0 standard drink of alcohol    Types: 1 Glasses of wine per week    Comment: 1/2 glass/day with lunch   Drug use: Never   Sexual activity: Not on file  Other Topics Concern   Not on file  Social History Narrative   Not on file   Social Determinants of Health   Financial Resource Strain: Not on file  Food Insecurity: Not on file  Transportation Needs: Not on file  Physical Activity: Not on file  Stress: Not on file  Social Connections: Not on file  Intimate Partner Violence: Not on file    History reviewed. No pertinent surgical history.  History reviewed. No pertinent family history.  Allergies  Allergen Reactions   Amoxicillin    Fish Allergy    Iodine    Latex        Latest Ref Rng & Units 12/17/2022  9:38 AM  CBC  WBC 4.0 - 10.5 K/uL 6.1   Hemoglobin 12.0 - 15.0 g/dL 9.8   Hematocrit 32.4 - 46.0 % 29.7   Platelets 150 - 400 K/uL 314       CMP     Component Value Date/Time   NA 138 12/17/2022 0938   K 3.3 (L) 12/17/2022 0938   CL 104 12/17/2022 0938   CO2 25 12/17/2022 0938   GLUCOSE 154 (H) 12/17/2022 0938   BUN 20 12/17/2022 0938   CREATININE 0.66 12/17/2022 0938   CALCIUM 8.6 (L) 12/17/2022 0938   PROT 6.6 12/17/2022 0938   ALBUMIN 3.4 (L) 12/17/2022 0938   AST 22 12/17/2022 0938   ALT 10 12/17/2022 0938   ALKPHOS 68 12/17/2022 0938   BILITOT 0.6 12/17/2022 0938   GFRNONAA >60 12/17/2022 0938     No results found.     Assessment & Plan:   1. Lymphedema The patient has been doing very well with conservative therapy thus far.  She continues to have some swelling in the left ankle but I suspect this is more so has to do with her mobility issues and her  hip pain.  I have previously discussed a lymphedema pump but my concern for the patient currently is if she becomes tangled in the pump and falls which is certainly notable given her history of falls.  Because she is able to control her swelling without any significant interventions currently I would like to have her continue using medical grade compression with return in 6 months or sooner if issues arise.   Current Outpatient Medications on File Prior to Visit  Medication Sig Dispense Refill   Acetaminophen Extra Strength 500 MG TABS Take 2 tablets by mouth as needed.     celecoxib (CELEBREX) 200 MG capsule Take 200 mg by mouth daily.     cholecalciferol (VITAMIN D3) 25 MCG (1000 UNIT) tablet Take 1,000 Units by mouth daily.     doxazosin (CARDURA XL) 4 MG 24 hr tablet Take 4 mg by mouth in the morning and at bedtime. Take 1/2 tablet     Elastic Bandages & Supports (JOBST KNEE HIGH COMPRESSION SM) MISC by Does not apply route.     psyllium (REGULOID) 0.52 g capsule Take 0.52 g by mouth daily.     senna (SENOKOT) 8.6 MG TABS tablet Take 1 tablet by mouth.     sertraline (ZOLOFT) 25 MG tablet Take 25 mg by mouth daily.     No current facility-administered medications on file prior to visit.    There are no Patient Instructions on file for this visit. No follow-ups on file.   Georgiana Spinner, NP

## 2023-06-13 DIAGNOSIS — I10 Essential (primary) hypertension: Secondary | ICD-10-CM | POA: Diagnosis not present

## 2023-06-13 DIAGNOSIS — F331 Major depressive disorder, recurrent, moderate: Secondary | ICD-10-CM | POA: Diagnosis not present

## 2023-06-20 DIAGNOSIS — M170 Bilateral primary osteoarthritis of knee: Secondary | ICD-10-CM | POA: Diagnosis not present

## 2023-06-20 DIAGNOSIS — F419 Anxiety disorder, unspecified: Secondary | ICD-10-CM | POA: Diagnosis not present

## 2023-06-20 DIAGNOSIS — I129 Hypertensive chronic kidney disease with stage 1 through stage 4 chronic kidney disease, or unspecified chronic kidney disease: Secondary | ICD-10-CM | POA: Diagnosis not present

## 2023-06-20 DIAGNOSIS — N182 Chronic kidney disease, stage 2 (mild): Secondary | ICD-10-CM | POA: Diagnosis not present

## 2023-07-03 DIAGNOSIS — G4700 Insomnia, unspecified: Secondary | ICD-10-CM | POA: Diagnosis not present

## 2023-07-03 DIAGNOSIS — F331 Major depressive disorder, recurrent, moderate: Secondary | ICD-10-CM | POA: Diagnosis not present

## 2023-07-03 DIAGNOSIS — G309 Alzheimer's disease, unspecified: Secondary | ICD-10-CM | POA: Diagnosis not present

## 2023-07-03 DIAGNOSIS — F419 Anxiety disorder, unspecified: Secondary | ICD-10-CM | POA: Diagnosis not present

## 2023-07-13 DIAGNOSIS — K5900 Constipation, unspecified: Secondary | ICD-10-CM | POA: Diagnosis not present

## 2023-07-13 DIAGNOSIS — I10 Essential (primary) hypertension: Secondary | ICD-10-CM | POA: Diagnosis not present

## 2023-07-13 DIAGNOSIS — K219 Gastro-esophageal reflux disease without esophagitis: Secondary | ICD-10-CM | POA: Diagnosis not present

## 2023-07-13 DIAGNOSIS — G309 Alzheimer's disease, unspecified: Secondary | ICD-10-CM | POA: Diagnosis not present

## 2023-08-21 DIAGNOSIS — F418 Other specified anxiety disorders: Secondary | ICD-10-CM | POA: Diagnosis not present

## 2023-08-21 DIAGNOSIS — G309 Alzheimer's disease, unspecified: Secondary | ICD-10-CM | POA: Diagnosis not present

## 2023-08-21 DIAGNOSIS — E559 Vitamin D deficiency, unspecified: Secondary | ICD-10-CM | POA: Diagnosis not present

## 2023-08-21 DIAGNOSIS — G4700 Insomnia, unspecified: Secondary | ICD-10-CM | POA: Diagnosis not present

## 2023-08-22 DIAGNOSIS — I10 Essential (primary) hypertension: Secondary | ICD-10-CM | POA: Diagnosis not present

## 2023-08-22 DIAGNOSIS — G309 Alzheimer's disease, unspecified: Secondary | ICD-10-CM | POA: Diagnosis not present

## 2023-08-22 DIAGNOSIS — F0280 Dementia in other diseases classified elsewhere without behavioral disturbance: Secondary | ICD-10-CM | POA: Diagnosis not present

## 2023-08-22 DIAGNOSIS — M1990 Unspecified osteoarthritis, unspecified site: Secondary | ICD-10-CM | POA: Diagnosis not present

## 2023-08-27 ENCOUNTER — Encounter: Payer: Self-pay | Admitting: Podiatry

## 2023-08-27 ENCOUNTER — Ambulatory Visit: Payer: Medicare Other | Admitting: Podiatry

## 2023-08-27 VITALS — Ht 59.0 in | Wt 95.0 lb

## 2023-08-27 DIAGNOSIS — B351 Tinea unguium: Secondary | ICD-10-CM

## 2023-08-27 DIAGNOSIS — M79674 Pain in right toe(s): Secondary | ICD-10-CM | POA: Diagnosis not present

## 2023-08-27 DIAGNOSIS — M79675 Pain in left toe(s): Secondary | ICD-10-CM

## 2023-08-27 NOTE — Progress Notes (Signed)
  Subjective:  Patient ID: Rebecca Michael, female    DOB: 1920-01-13,  MRN: [id number]  89 y.o. female presents with painful thick toenails that are difficult to trim. Pain interferes with ambulation. Aggravating factors include wearing enclosed shoe gear. Pain is relieved with periodic professional debridement. She is accompanied by her daughter on today's visit. Patient has now moved to [facility] [place]. Chief Complaint  Patient presents with   Nail Problem    Pt is here for RFC, not a diabetic PCP is Dr Clovis Riley and LOV was in November.   PCP: Smiley Houseman, NP.  New problem(s): None.   Review of Systems: Negative except as noted in the HPI.   Allergies  Allergen Reactions   Amoxicillin    Fish Allergy    Iodine    Latex     Objective:  There were no vitals filed for this visit. Constitutional Patient is a pleasant 89 y.o. female frail, in NAD. AAO x 3.  Vascular Capillary fill time to digits <3 seconds.  DP/PT pulse(s) are faintly palpable b/l lower extremities. Pedal hair absent b/l. Lower extremity skin temperature gradient warm to cool b/l. No pain with calf compression b/l. No cyanosis or clubbing noted. No ischemia nor gangrene noted b/l.   Neurologic Protective sensation intact 5/5 intact bilaterally with 10g monofilament b/l. Vibratory sensation intact b/l. No clonus b/l.   Dermatologic Pedal skin is thin, shiny and atrophic b/l.  No open wounds b/l lower extremities. No interdigital macerations b/l lower extremities. Toenails 1-5 b/l elongated, discolored, dystrophic, thickened, crumbly with subungual debris and tenderness to dorsal palpation. Dried heme noted on right great toe. No erythema, no edema, no drainage, no fluctuance.  Orthopedic: Normal muscle strength 5/5 to all lower extremity muscle groups bilaterally. Utilizes wheelchair for mobility assistance.   Last HgA1c:      No data to display         Assessment:   1. Pain due to  onychomycosis of toenails of both feet    Plan:  -Patient's family member present. All questions/concerns addressed on today's visit. -Examined patient. -Continue supportive shoe gear daily. -Mycotic toenails 1-5 bilaterally were debrided in length and girth with sterile nail nippers and dremel without incident. -Patient/POA to call should there be question/concern in the interim.  Return in about 3 months (around 11/25/2023).  Freddie Breech, DPM      [place] LOCATION: [address], [place] [address]                   Office [phone]   [facility] LOCATION: [address] [place], [place] [address] Office [phone]

## 2023-09-22 DIAGNOSIS — E559 Vitamin D deficiency, unspecified: Secondary | ICD-10-CM | POA: Diagnosis not present

## 2023-09-22 DIAGNOSIS — I10 Essential (primary) hypertension: Secondary | ICD-10-CM | POA: Diagnosis not present

## 2023-09-25 DIAGNOSIS — N182 Chronic kidney disease, stage 2 (mild): Secondary | ICD-10-CM | POA: Diagnosis not present

## 2023-09-25 DIAGNOSIS — D649 Anemia, unspecified: Secondary | ICD-10-CM | POA: Diagnosis not present

## 2023-09-25 DIAGNOSIS — G309 Alzheimer's disease, unspecified: Secondary | ICD-10-CM | POA: Diagnosis not present

## 2023-09-25 DIAGNOSIS — E559 Vitamin D deficiency, unspecified: Secondary | ICD-10-CM | POA: Diagnosis not present

## 2023-10-24 DIAGNOSIS — F0280 Dementia in other diseases classified elsewhere without behavioral disturbance: Secondary | ICD-10-CM | POA: Diagnosis not present

## 2023-10-24 DIAGNOSIS — G309 Alzheimer's disease, unspecified: Secondary | ICD-10-CM | POA: Diagnosis not present

## 2023-10-24 DIAGNOSIS — E559 Vitamin D deficiency, unspecified: Secondary | ICD-10-CM | POA: Diagnosis not present

## 2023-10-24 DIAGNOSIS — F4310 Post-traumatic stress disorder, unspecified: Secondary | ICD-10-CM | POA: Diagnosis not present

## 2023-10-29 DIAGNOSIS — F418 Other specified anxiety disorders: Secondary | ICD-10-CM | POA: Diagnosis not present

## 2023-11-04 ENCOUNTER — Other Ambulatory Visit: Payer: Self-pay

## 2023-11-04 DIAGNOSIS — K4090 Unilateral inguinal hernia, without obstruction or gangrene, not specified as recurrent: Secondary | ICD-10-CM | POA: Diagnosis not present

## 2023-11-04 DIAGNOSIS — I10 Essential (primary) hypertension: Secondary | ICD-10-CM | POA: Diagnosis not present

## 2023-11-04 DIAGNOSIS — K4091 Unilateral inguinal hernia, without obstruction or gangrene, recurrent: Secondary | ICD-10-CM | POA: Diagnosis not present

## 2023-11-04 DIAGNOSIS — R1111 Vomiting without nausea: Secondary | ICD-10-CM | POA: Diagnosis not present

## 2023-11-04 LAB — CBC
HCT: 31.8 % — ABNORMAL LOW (ref 36.0–46.0)
Hemoglobin: 9.8 g/dL — ABNORMAL LOW (ref 12.0–15.0)
MCH: 27.1 pg (ref 26.0–34.0)
MCHC: 30.8 g/dL (ref 30.0–36.0)
MCV: 87.8 fL (ref 80.0–100.0)
Platelets: 222 10*3/uL (ref 150–400)
RBC: 3.62 MIL/uL — ABNORMAL LOW (ref 3.87–5.11)
RDW: 15.4 % (ref 11.5–15.5)
WBC: 5.2 10*3/uL (ref 4.0–10.5)
nRBC: 0 % (ref 0.0–0.2)

## 2023-11-04 LAB — COMPREHENSIVE METABOLIC PANEL
ALT: 9 U/L (ref 0–44)
AST: 22 U/L (ref 15–41)
Albumin: 2.5 g/dL — ABNORMAL LOW (ref 3.5–5.0)
Alkaline Phosphatase: 68 U/L (ref 38–126)
Anion gap: 12 (ref 5–15)
BUN: 18 mg/dL (ref 8–23)
CO2: 20 mmol/L — ABNORMAL LOW (ref 22–32)
Calcium: 8.1 mg/dL — ABNORMAL LOW (ref 8.9–10.3)
Chloride: 104 mmol/L (ref 98–111)
Creatinine, Ser: 0.5 mg/dL (ref 0.44–1.00)
GFR, Estimated: >60 mL/min (ref >60)
Glucose, Bld: 86 mg/dL (ref 70–99)
Potassium: 3.8 mmol/L (ref 3.5–5.1)
Sodium: 136 mmol/L (ref 135–145)
Total Bilirubin: 0.8 mg/dL (ref 0.0–1.2)
Total Protein: 5.5 g/dL — ABNORMAL LOW (ref 6.5–8.1)

## 2023-11-04 NOTE — ED Triage Notes (Signed)
Pt has a groin hernia that has become larger and causing pt pain. Per EMS pt did vomit once today.

## 2023-11-04 NOTE — ED Triage Notes (Signed)
Pt comes via EMS from [facility] with possible hernia in groin area. Pt states pain worse and bigger since this morning. pt did vomit once today   Pt is at baseline. VSS

## 2023-11-04 NOTE — ED Provider Triage Note (Signed)
Emergency Medicine Provider Triage Evaluation Note  Rebecca Michael , a 89 y.o. female  was evaluated in triage.  Pt complains of possible right inguinal hernia. Sent over by [facility] for further evaluation.   Review of Systems  Positive: vomiting Negative:   Physical Exam  BP (!) 145/85 (BP Location: Right Arm)   Pulse 71   Temp 97.6 F (36.4 C) (Oral)   Resp 18   Wt 36.3 kg   SpO2 96%   BMI 16.16 kg/m  Gen:   Awake, no distress   Resp:  Normal effort  MSK:   Moves extremities without difficulty  Other:  Large mass to RLQ. Non-reducible. Normal skin appearance.   Medical Decision Making  Medically screening exam initiated at 6:18 PM.  Appropriate orders placed.  Rebecca Michael was informed that the remainder of the evaluation will be completed by another provider, this initial triage assessment does not replace that evaluation, and the importance of remaining in the ED until their evaluation is complete.    Romeo Apple, Achsah Mcquade A, PA-C 11/04/23 1823

## 2023-11-05 ENCOUNTER — Emergency Department
Admission: EM | Admit: 2023-11-05 | Discharge: 2023-11-05 | Disposition: A | Discharge status: Home / Self Care | Payer: Medicare Other | Attending: Emergency Medicine | Admitting: Emergency Medicine

## 2023-11-05 DIAGNOSIS — M25551 Pain in right hip: Secondary | ICD-10-CM | POA: Diagnosis not present

## 2023-11-05 DIAGNOSIS — K4091 Unilateral inguinal hernia, without obstruction or gangrene, recurrent: Secondary | ICD-10-CM

## 2023-11-05 DIAGNOSIS — K4090 Unilateral inguinal hernia, without obstruction or gangrene, not specified as recurrent: Secondary | ICD-10-CM | POA: Diagnosis not present

## 2023-11-05 DIAGNOSIS — R1110 Vomiting, unspecified: Secondary | ICD-10-CM | POA: Diagnosis not present

## 2023-11-05 MED ORDER — ONDANSETRON 4 MG PO TBDP: 4.0000 mg | ORAL_TABLET | Freq: Four times a day (QID) | ORAL | 0 refills | Status: DC | PRN

## 2023-11-05 MED ORDER — TRAMADOL HCL 50 MG PO TABS
50.0000 mg | ORAL_TABLET | Freq: Once | ORAL | Status: AC
  Administered 2023-11-05: 50 mg via ORAL
  Filled 2023-11-05: qty 1

## 2023-11-05 MED ORDER — ACETAMINOPHEN 325 MG PO TABS
650.0000 mg | ORAL_TABLET | Freq: Once | ORAL | Status: AC
  Administered 2023-11-05: 650 mg via ORAL
  Filled 2023-11-05: qty 2

## 2023-11-05 MED ORDER — ONDANSETRON 4 MG PO TBDP
4.0000 mg | ORAL_TABLET | Freq: Once | ORAL | Status: AC
  Administered 2023-11-05: 4 mg via ORAL
  Filled 2023-11-05: qty 1

## 2023-11-05 NOTE — ED Notes (Signed)
Ems called to transport patient back to [facility]

## 2023-11-05 NOTE — ED Notes (Signed)
Pt asking for pain meds. This RN ordered som PO tylenol.

## 2023-11-05 NOTE — ED Provider Notes (Signed)
[facility] Provider Note    Event Date/Time   First MD Initiated Contact with Patient 11/05/23 0121     (approximate)   History   Hernia and Groin Pain   HPI  Rebecca Michael is a 89 y.o. female with history of arthritis, lymphedema, chronic hip pain who presents to the emergency department for evaluation of a right inguinal hernia.  Family member at bedside states that the hernia has been there for years.  She said that the nursing staff tonight thought that the hernia looked larger than normal and when they pushed on it the patient vomited.  No further vomiting.  Family is not sure when she had her last bowel movement.  No fever.   Patient is on hospice.  History provided by patient, daughter.    Past Medical History:  Diagnosis Date   Lymphedema    Pain due to onychomycosis of toenail     History reviewed. No pertinent surgical history.  MEDICATIONS:  Prior to Admission medications   Medication Sig Start Date End Date Taking? Authorizing Provider  Acetaminophen Extra Strength 500 MG TABS Take 2 tablets by mouth as needed. 12/28/22   [provider]  celecoxib (CELEBREX) 200 MG capsule Take 200 mg by mouth daily.    [provider]  cholecalciferol (VITAMIN D3) 25 MCG (1000 UNIT) tablet Take 1,000 Units by mouth daily.    [provider]  doxazosin (CARDURA XL) 4 MG 24 hr tablet Take 4 mg by mouth in the morning and at bedtime. Take 1/2 tablet    [provider]  Elastic Bandages & Supports (JOBST KNEE HIGH COMPRESSION SM) MISC by Does not apply route.    [provider]  psyllium (REGULOID) 0.52 g capsule Take 0.52 g by mouth daily.    [provider]  senna (SENOKOT) 8.6 MG TABS tablet Take 1 tablet by mouth.    [provider]  sertraline (ZOLOFT) 25 MG tablet Take 25 mg by mouth daily.    [provider]    Physical Exam   Triage Vital Signs: ED Triage Vitals   Encounter Vitals Group     BP 11/04/23 1806 (!) 145/85     Systolic BP Percentile --      Diastolic BP Percentile --      Pulse Rate 11/04/23 1806 71     Resp 11/04/23 1806 18     Temp 11/04/23 1806 97.6 F (36.4 C)     Temp Source 11/04/23 1806 Oral     SpO2 11/04/23 1806 96 %     Weight 11/04/23 1806 80 lb (36.3 kg)     Height --      Head Circumference --      Peak Flow --      Pain Score 11/04/23 1803 8     Pain Loc --      Pain Education --      Exclude from Growth Chart --     Most recent vital signs: Vitals:   11/04/23 1806 11/05/23 0017  BP: (!) 145/85 (!) 144/102  Pulse: 71 97  Resp: 18 18  Temp: 97.6 F (36.4 C) (!) 97.5 F (36.4 C)  SpO2: 96% 93%    CONSTITUTIONAL: Alert, responds appropriately to questions.  Elderly, thin, resting comfortably HEAD: Normocephalic, atraumatic EYES: Conjunctivae clear, pupils appear equal, sclera nonicteric ENT: normal nose; moist mucous membranes NECK: Supple, normal ROM CARD: RRR; S1 and S2 appreciated RESP: Normal chest excursion  without splinting or tachypnea; breath sounds clear and equal bilaterally; no wheezes, no rhonchi, no rales, no hypoxia or respiratory distress, speaking full sentences ABD/GI: Non-distended; soft, non-tender, no rebound, no guarding, no peritoneal signs, right inguinal hernia that is easily reducible without overlying skin changes and no abdominal tenderness BACK: The back appears normal EXT: Normal ROM in all joints; no deformity noted, no edema, no deformity of her hips, keeps her hips and knees flexed which is her position of comfort, extremities warm well-perfused, compartments soft, no calf tenderness or calf swelling SKIN: Normal color for age and race; warm; no rash on exposed skin NEURO: Moves all extremities equally, normal speech PSYCH: The patient's mood and manner are appropriate.   ED Results / Procedures / Treatments   LABS: (all labs ordered are listed, but only abnormal results  are displayed) Labs Reviewed  CBC - Abnormal; Notable for the following components:      Result Value   RBC 3.62 (*)    Hemoglobin 9.8 (*)    HCT 31.8 (*)    All other components within normal limits  COMPREHENSIVE METABOLIC PANEL - Abnormal; Notable for the following components:   CO2 20 (*)    Calcium 8.1 (*)    Total Protein 5.5 (*)    Albumin 2.5 (*)    All other components within normal limits     EKG:   RADIOLOGY: My personal review and interpretation of imaging:    I have personally reviewed all radiology reports.   No results found.   PROCEDURES:  Critical Care performed: No    Procedures    IMPRESSION / MDM / ASSESSMENT AND PLAN / ED COURSE  I reviewed the triage vital signs and the nursing notes.    Patient here for chronic right inguinal hernia, 1 episode of vomiting.    DIFFERENTIAL DIAGNOSIS (includes but not limited to):   Reducible hernia, no signs of incarceration or strangulation, doubt bowel obstruction   Patient's presentation is most consistent with acute presentation with potential threat to life or bodily function.   PLAN: Labs show no leukocytosis.  Chronic and stable anemia.  Normal creatinine, LFTs.  Hernia is easily reduced on exam without overlying skin changes, tenderness.  She has not vomited in several hours.  No indication for emergent imaging of the abdomen.  I talked to family member about supportive care instructions and offered surgical follow-up but given her age, it is very unlikely that she would be a candidate for surgery and family agrees.  Family is ready to be discharged home and for patient to be sent back to the nursing facility.  I do not feel there is any need for further emergent workup.  Daughter request that we give her pain medication for her chronic hip pain I feel is very reasonable.  Will also give nausea medicine here and allow her to eat and drink until she can be picked up and taken back to her nursing  facility by EMS.   MEDICATIONS GIVEN IN ED: Medications  traMADol (ULTRAM) tablet 50 mg (has no administration in time range)  ondansetron (ZOFRAN-ODT) disintegrating tablet 4 mg (has no administration in time range)  acetaminophen (TYLENOL) tablet 650 mg (650 mg Oral Given 11/05/23 0008)     ED COURSE:  At this time, I do not feel there is any life-threatening condition present. I reviewed all nursing notes, vitals, pertinent previous records.  All lab and urine results, EKGs, imaging ordered have been independently reviewed and  interpreted by myself.  I reviewed all available radiology reports from any imaging ordered this visit.  Based on my assessment, I feel the patient is safe to be discharged home without further emergent workup and can continue workup as an outpatient as needed. Discussed all findings, treatment plan as well as usual and customary return precautions.  They verbalize understanding and are comfortable with this plan.  Outpatient follow-up has been provided as needed.  All questions have been answered.    CONSULTS:  none   OUTSIDE RECORDS REVIEWED: Reviewed last vascular surgery note and September 2024.       FINAL CLINICAL IMPRESSION(S) / ED DIAGNOSES   Final diagnoses:  Unilateral recurrent inguinal hernia without obstruction or gangrene     Rx / DC Orders   ED Discharge Orders          Ordered    ondansetron (ZOFRAN-ODT) 4 MG disintegrating tablet  Every 6 hours PRN        11/05/23 0215             Note:  This document was prepared using Dragon voice recognition software and may include unintentional dictation errors.   Jhade Berko, Layla Maw, DO 11/05/23 [phone]

## 2023-11-15 DIAGNOSIS — M25551 Pain in right hip: Secondary | ICD-10-CM | POA: Diagnosis not present

## 2023-11-15 DIAGNOSIS — F418 Other specified anxiety disorders: Secondary | ICD-10-CM | POA: Diagnosis not present

## 2023-11-26 ENCOUNTER — Ambulatory Visit: Payer: Medicare Other | Admitting: Podiatry

## 2023-11-27 ENCOUNTER — Ambulatory Visit (INDEPENDENT_AMBULATORY_CARE_PROVIDER_SITE_OTHER): Payer: Medicare Other | Admitting: Vascular Surgery

## 2023-12-18 DEATH — deceased
